# Patient Record
Sex: Female | Born: 1990
Health system: Southern US, Community
[De-identification: ages and names within clinical notes are randomized; demographics above are authoritative.]

## PROBLEM LIST (undated history)

## (undated) DIAGNOSIS — F419 Anxiety disorder, unspecified: Secondary | ICD-10-CM

## (undated) DIAGNOSIS — R251 Tremor, unspecified: Secondary | ICD-10-CM

## (undated) HISTORY — PX: APPENDECTOMY: SHX54

## (undated) HISTORY — PX: WISDOM TOOTH EXTRACTION: SHX21

## (undated) HISTORY — DX: Anxiety disorder, unspecified: F41.9

## (undated) HISTORY — DX: Tremor, unspecified: R25.1

---

## 2014-04-10 DIAGNOSIS — K644 Residual hemorrhoidal skin tags: Secondary | ICD-10-CM | POA: Insufficient documentation

## 2014-04-10 DIAGNOSIS — G250 Essential tremor: Secondary | ICD-10-CM | POA: Insufficient documentation

## 2014-04-10 DIAGNOSIS — H6980 Other specified disorders of Eustachian tube, unspecified ear: Secondary | ICD-10-CM | POA: Insufficient documentation

## 2014-07-03 HISTORY — PX: ARM WOUND REPAIR / CLOSURE: SUR1141

## 2015-06-15 ENCOUNTER — Ambulatory Visit (INDEPENDENT_AMBULATORY_CARE_PROVIDER_SITE_OTHER): Payer: 59 | Admitting: Internal Medicine

## 2015-06-15 ENCOUNTER — Telehealth: Payer: Self-pay | Admitting: Internal Medicine

## 2015-06-15 ENCOUNTER — Encounter: Payer: Self-pay | Admitting: Internal Medicine

## 2015-06-15 VITALS — BP 142/88 | HR 80 | Temp 98.0°F | Resp 16 | Ht 67.0 in | Wt 145.0 lb

## 2015-06-15 DIAGNOSIS — J0100 Acute maxillary sinusitis, unspecified: Secondary | ICD-10-CM | POA: Diagnosis not present

## 2015-06-15 DIAGNOSIS — J0101 Acute recurrent maxillary sinusitis: Secondary | ICD-10-CM

## 2015-06-15 MED ORDER — MOXIFLOXACIN HCL 400 MG PO TABS: 400.0000 mg | ORAL_TABLET | Freq: Every day | ORAL | Status: DC

## 2015-06-15 MED ORDER — LEVOFLOXACIN 500 MG PO TABS: 500.0000 mg | ORAL_TABLET | Freq: Every day | ORAL | Status: AC

## 2015-06-15 NOTE — Progress Notes (Signed)
Subjective:  Patient ID: Erin Byrd, female    DOB: [birth year]  Age: 24 y.o. MRN: [id number]  CC: Sinusitis   HPI Erin Byrd presents for a 5 day history of sinus pain, runny nose, sore throat, chills. She is taking Sudafed for symptom relief.  No outpatient prescriptions prior to visit.   No facility-administered medications prior to visit.    ROS Review of Systems  Constitutional: Positive for chills. Negative for fever, diaphoresis, activity change, appetite change and unexpected weight change.  HENT: Positive for congestion, postnasal drip, rhinorrhea, sinus pressure and sore throat. Negative for facial swelling, nosebleeds, sneezing, tinnitus, trouble swallowing and voice change.   Eyes: Negative.   Respiratory: Negative.  Negative for cough, choking, shortness of breath and stridor.   Cardiovascular: Negative.  Negative for chest pain, palpitations and leg swelling.  Gastrointestinal: Negative.  Negative for abdominal pain.  Endocrine: Negative.   Genitourinary: Negative.   Musculoskeletal: Negative.  Negative for myalgias and back pain.  Skin: Negative.  Negative for color change and rash.  Allergic/Immunologic: Negative.   Neurological: Negative.  Negative for dizziness, weakness and light-headedness.  Hematological: Negative.  Negative for adenopathy. Does not bruise/bleed easily.  Psychiatric/Behavioral: Negative.     Objective:  BP 142/88 mmHg  Pulse 80  Temp(Src) 98 F (36.7 C) (Oral)  Resp 16  Ht 5\' 7"  (1.702 m)  Wt 145 lb (65.772 kg)  BMI 22.71 kg/m2  SpO2 95%  LMP 05/25/2015  BP Readings from Last 3 Encounters:  06/15/15 142/88    Wt Readings from Last 3 Encounters:  06/15/15 145 lb (65.772 kg)    Physical Exam  Constitutional: She is oriented to person, place, and time.  Non-toxic appearance. She does not have a sickly appearance. She does not appear ill. No distress.  HENT:  Right Ear: Hearing, tympanic membrane, external ear and ear  canal normal.  Left Ear: Hearing, tympanic membrane, external ear and ear canal normal.  Nose: Mucosal edema and rhinorrhea present. Right sinus exhibits maxillary sinus tenderness. Right sinus exhibits no frontal sinus tenderness. Left sinus exhibits maxillary sinus tenderness. Left sinus exhibits no frontal sinus tenderness.  Mouth/Throat: Oropharynx is clear and moist and mucous membranes are normal. Mucous membranes are not pale, not dry and not cyanotic. No oral lesions. No trismus in the jaw. No uvula swelling. No oropharyngeal exudate, posterior oropharyngeal edema, posterior oropharyngeal erythema or tonsillar abscesses.  Eyes: Conjunctivae are normal. Right eye exhibits no discharge. Left eye exhibits no discharge. No scleral icterus.  Neck: Normal range of motion. Neck supple. No JVD present. No tracheal deviation present. No thyromegaly present.  Cardiovascular: Normal rate, regular rhythm, normal heart sounds and intact distal pulses.  Exam reveals no gallop and no friction rub.   No murmur heard. Pulmonary/Chest: Effort normal and breath sounds normal. No stridor. No respiratory distress. She has no wheezes. She has no rales. She exhibits no tenderness.  Abdominal: Soft. Bowel sounds are normal. She exhibits no distension and no mass. There is no tenderness. There is no rebound and no guarding.  Musculoskeletal: Normal range of motion. She exhibits no edema or tenderness.  Lymphadenopathy:    She has no cervical adenopathy.  Neurological: She is oriented to person, place, and time.  Skin: Skin is warm and dry. No rash noted. She is not diaphoretic. No erythema. No pallor.    No results found for: WBC, HGB, HCT, PLT, GLUCOSE, CHOL, TRIG, HDL, LDLDIRECT, LDLCALC, ALT, AST, NA, K, CL, CREATININE, BUN, CO2,  TSH, PSA, INR, GLUF, HGBA1C, MICROALBUR  Patient was never admitted.  Assessment & Plan:   Erin Byrd was seen today for sinusitis.  Diagnoses and all orders for this visit:  Acute  maxillary sinusitis, recurrence not specified- she will be out of work today for bedrest, she will continue Sudafed, will treat the infection with a fluoroquinolone since she is allergic to penicillins and macrolides. -     Discontinue: moxifloxacin (AVELOX) 400 MG tablet; Take 1 tablet (400 mg total) by mouth daily at 8 pm.   I am having Erin Byrd maintain her JUNEL FE 1/20 and propranolol.  Meds ordered this encounter  Medications  . JUNEL FE 1/20 1-20 MG-MCG tablet    Sig:     Refill:  1  . propranolol (INDERAL) 20 MG tablet    Sig:     Refill:  1  . DISCONTD: moxifloxacin (AVELOX) 400 MG tablet    Sig: Take 1 tablet (400 mg total) by mouth daily at 8 pm.    Dispense:  7 tablet    Refill:  0     Follow-up: Return in about 3 weeks (around 07/06/2015).  Scarlette Calico, MD

## 2015-06-15 NOTE — Patient Instructions (Signed)

## 2015-06-15 NOTE — Telephone Encounter (Signed)
changed

## 2015-06-15 NOTE — Telephone Encounter (Signed)
[facility] on [address] [place] called stating prescription for moxifloxacin (AVELOX) 400 MG tablet [id number]. They say ciprofloxacin or the levofloxacin will be cheaper and wants to know if you want change it to one of them.

## 2015-07-09 ENCOUNTER — Encounter: Payer: Self-pay | Admitting: Family

## 2015-07-09 ENCOUNTER — Ambulatory Visit (INDEPENDENT_AMBULATORY_CARE_PROVIDER_SITE_OTHER): Payer: 59 | Admitting: Family

## 2015-07-09 ENCOUNTER — Ambulatory Visit: Payer: 59 | Admitting: Family

## 2015-07-09 VITALS — BP 138/92 | HR 83 | Temp 98.1°F | Resp 18 | Ht 67.0 in | Wt 156.0 lb

## 2015-07-09 DIAGNOSIS — Z0000 Encounter for general adult medical examination without abnormal findings: Secondary | ICD-10-CM | POA: Diagnosis not present

## 2015-07-09 NOTE — Progress Notes (Signed)
Subjective:    Patient ID: Erin Byrd, female    DOB: October 11, 1990, 25 y.o.   MRN: [id number]  Chief Complaint  Patient presents with  . CPE    not fasting    HPI:  Erin Byrd is a 25 y.o. female who presents today for an annual wellness visit.   1) Health Maintenance -   Diet - Averages 3 meals per day consisting of cereal, dairy, starches, fruits, vegetables, chicken, beef, pork and fish  Exercise - No current structured exercise   2) Preventative Exams / Immunizations:  Dental -- Up to date  Vision -- Up to date   Health Maintenance  Topic Date Due  . INFLUENZA VACCINE  02/01/2016  . PAP SMEAR  01/31/2018  . TETANUS/TDAP  04/02/2023  . HIV Screening  Addressed    Immunization History  Administered Date(s) Administered  . Influenza-Unspecified 02/22/2015  . Tdap 04/01/2013    Allergies  Allergen Reactions  . Erythromycin Nausea And Vomiting  . Penicillins Rash     Outpatient Prescriptions Prior to Visit  Medication Sig Dispense Refill  . JUNEL FE 1/20 1-20 MG-MCG tablet   1  . propranolol (INDERAL) 20 MG tablet   1   No facility-administered medications prior to visit.     Past Medical History  Diagnosis Date  . Chicken pox      Past Surgical History  Procedure Laterality Date  . Wisdom tooth extraction    . Arm surgery       Family History  Problem Relation Age of Onset  . Hypertension Mother   . Hypertension Father   . Hypertension Paternal Grandmother   . Hypertension Paternal Grandfather   . Diabetes Paternal Grandfather      Social History   Social History  . Marital Status: Single    Spouse Name: N/A  . Number of Children: 0  . Years of Education: 16   Occupational History  . Clinic Registrar    Social History Main Topics  . Smoking status: Never Smoker   . Smokeless tobacco: Never Used  . Alcohol Use: 1.2 - 1.8 oz/week    0 Standard drinks or equivalent, 2-3 Cans of beer per week  . Drug Use: No  .  Sexual Activity: Yes    Birth Control/ Protection: Pill   Other Topics Concern  . Not on file   Social History Narrative   Fun: Relax at home, go out with friends, shop    Review of Systems  Constitutional: Denies fever, chills, fatigue, or significant weight gain/loss. HENT: Head: Denies headache or neck pain Ears: Denies changes in hearing, ringing in ears, earache, drainage Nose: Denies discharge, stuffiness, itching, nosebleed, sinus pain Throat: Denies sore throat, hoarseness, dry mouth, sores, thrush Eyes: Denies loss/changes in vision, pain, redness, blurry/double vision, flashing lights Cardiovascular: Denies chest pain/discomfort, tightness, palpitations, shortness of breath with activity, difficulty lying down, swelling, sudden awakening with shortness of breath Respiratory: Denies shortness of breath, cough, sputum production, wheezing Gastrointestinal: Denies dysphasia, heartburn, change in appetite, nausea, change in bowel habits, rectal bleeding, constipation, diarrhea, yellow skin or eyes Genitourinary: Denies frequency, urgency, burning/pain, blood in urine, incontinence, change in urinary strength. Musculoskeletal: Denies muscle/joint pain, stiffness, back pain, redness or swelling of joints, trauma Skin: Denies rashes, lumps, itching, dryness, color changes, or hair/nail changes Neurological: Denies dizziness, fainting, seizures, weakness, numbness, tingling, tremor Psychiatric - Denies nervousness, stress, depression or memory loss Endocrine: Denies heat or cold intolerance, sweating, frequent urination, excessive thirst,  changes in appetite Hematologic: Denies ease of bruising or bleeding     Objective:     BP 138/92 mmHg  Pulse 83  Temp(Src) 98.1 F (36.7 C) (Oral)  Resp 18  Ht 5\' 7"  (1.702 m)  Wt 156 lb (70.761 kg)  BMI 24.43 kg/m2  SpO2 98%  LMP 05/25/2015 Nursing note and vital signs reviewed.  Physical Exam  Constitutional: She is oriented to  person, place, and time. She appears well-developed and well-nourished.  HENT:  Head: Normocephalic.  Right Ear: Hearing, tympanic membrane, external ear and ear canal normal.  Left Ear: Hearing, tympanic membrane, external ear and ear canal normal.  Nose: Nose normal.  Mouth/Throat: Uvula is midline, oropharynx is clear and moist and mucous membranes are normal.  Eyes: Conjunctivae and EOM are normal. Pupils are equal, round, and reactive to light.  Neck: Neck supple. No JVD present. No tracheal deviation present. No thyromegaly present.  Cardiovascular: Normal rate, regular rhythm, normal heart sounds and intact distal pulses.   Pulmonary/Chest: Effort normal and breath sounds normal.  Abdominal: Soft. Bowel sounds are normal. She exhibits no distension and no mass. There is no tenderness. There is no rebound and no guarding.  Musculoskeletal: Normal range of motion. She exhibits no edema or tenderness.  Lymphadenopathy:    She has no cervical adenopathy.  Neurological: She is alert and oriented to person, place, and time. She has normal reflexes. No cranial nerve deficit. She exhibits normal muscle tone. Coordination normal.  Skin: Skin is warm and dry.  Psychiatric: She has a normal mood and affect. Her behavior is normal. Judgment and thought content normal.       Assessment & Plan:   Problem List Items Addressed This Visit      Other   Routine general medical examination at a health care facility - Primary    1) Anticipatory Guidance: Discussed importance of wearing a seatbelt while driving and not texting while driving; changing batteries in smoke detector at least once annually; wearing suntan lotion when outside; eating a balanced and moderate diet; getting physical activity at least 30 minutes per day.  2) Immunizations / Screenings / Labs:  All immunizations are up-to-date per recommendations. All screenings are up-to-date per recommendations. Obtain CBC, CMET, Lipid profile  and TSH.   Overall well exam with minimal risk factors for cardiovascular disease. Encouraged increased physical activity with goal of 30 minutes most days a week with moderate level intensity. She is of good weight. Encourage continue nutritional intake that is moderate and varied and emphasizes nutrient dense foods that are low in saturated/transfer fats. Continue healthy lifestyle behaviors and choices. Follow-up office visit pending blood work. Follow-up prevention exam in 1 year.       Relevant Orders   Lipid panel   Comprehensive metabolic panel   CBC   TSH

## 2015-07-09 NOTE — Patient Instructions (Signed)
Thank you for choosing [facility].  Summary/Instructions:  Health Maintenance, Female Adopting a healthy lifestyle and getting preventive care can go a long way to promote health and wellness. Talk with your health care provider about what schedule of regular examinations is right for you. This is a good chance for you to check in with your provider about disease prevention and staying healthy. In between checkups, there are plenty of things you can do on your own. Experts have done a lot of research about which lifestyle changes and preventive measures are most likely to keep you healthy. Ask your health care provider for more information. WEIGHT AND DIET  Eat a healthy diet  Be sure to include plenty of vegetables, fruits, low-fat dairy products, and lean protein.  Do not eat a lot of foods high in solid fats, added sugars, or salt.  Get regular exercise. This is one of the most important things you can do for your health.  Most adults should exercise for at least 150 minutes each week. The exercise should increase your heart rate and make you sweat (moderate-intensity exercise).  Most adults should also do strengthening exercises at least twice a week. This is in addition to the moderate-intensity exercise.  Maintain a healthy weight  Body mass index (BMI) is a measurement that can be used to identify possible weight problems. It estimates body fat based on height and weight. Your health care provider can help determine your BMI and help you achieve or maintain a healthy weight.  For females 28 years of age and older:   A BMI below 18.5 is considered underweight.  A BMI of 18.5 to 24.9 is normal.  A BMI of 25 to 29.9 is considered overweight.  A BMI of 30 and above is considered obese.  Watch levels of cholesterol and blood lipids  You should start having your blood tested for lipids and cholesterol at 25 years of age, then have this test every 5 years.  You may need  to have your cholesterol levels checked more often if:  Your lipid or cholesterol levels are high.  You are older than 25 years of age.  You are at high risk for heart disease.  CANCER SCREENING   Lung Cancer  Lung cancer screening is recommended for adults 76-55 years old who are at high risk for lung cancer because of a history of smoking.  A yearly low-dose CT scan of the lungs is recommended for people who:  Currently smoke.  Have quit within the past 15 years.  Have at least a 30-pack-year history of smoking. A pack year is smoking an average of one pack of cigarettes a day for 1 year.  Yearly screening should continue until it has been 15 years since you quit.  Yearly screening should stop if you develop a health problem that would prevent you from having lung cancer treatment.  Breast Cancer  Practice breast self-awareness. This means understanding how your breasts normally appear and feel.  It also means doing regular breast self-exams. Let your health care provider know about any changes, no matter how small.  If you are in your 20s or 30s, you should have a clinical breast exam (CBE) by a health care provider every 1-3 years as part of a regular health exam.  If you are 28 or older, have a CBE every year. Also consider having a breast X-ray (mammogram) every year.  If you have a family history of breast cancer, talk to your  health care provider about genetic screening.  If you are at high risk for breast cancer, talk to your health care provider about having an MRI and a mammogram every year.  Breast cancer gene (BRCA) assessment is recommended for women who have family members with BRCA-related cancers. BRCA-related cancers include:  Breast.  Ovarian.  Tubal.  Peritoneal cancers.  Results of the assessment will determine the need for genetic counseling and BRCA1 and BRCA2 testing. Cervical Cancer Your health care provider may recommend that you be  screened regularly for cancer of the pelvic organs (ovaries, uterus, and vagina). This screening involves a pelvic examination, including checking for microscopic changes to the surface of your cervix (Pap test). You may be encouraged to have this screening done every 3 years, beginning at age 78.  For women ages 13-65, health care providers may recommend pelvic exams and Pap testing every 3 years, or they may recommend the Pap and pelvic exam, combined with testing for human papilloma virus (HPV), every 5 years. Some types of HPV increase your risk of cervical cancer. Testing for HPV may also be done on women of any age with unclear Pap test results.  Other health care providers may not recommend any screening for nonpregnant women who are considered low risk for pelvic cancer and who do not have symptoms. Ask your health care provider if a screening pelvic exam is right for you.  If you have had past treatment for cervical cancer or a condition that could lead to cancer, you need Pap tests and screening for cancer for at least 20 years after your treatment. If Pap tests have been discontinued, your risk factors (such as having a new sexual partner) need to be reassessed to determine if screening should resume. Some women have medical problems that increase the chance of getting cervical cancer. In these cases, your health care provider may recommend more frequent screening and Pap tests. Colorectal Cancer  This type of cancer can be detected and often prevented.  Routine colorectal cancer screening usually begins at 25 years of age and continues through 25 years of age.  Your health care provider may recommend screening at an earlier age if you have risk factors for colon cancer.  Your health care provider may also recommend using home test kits to check for hidden blood in the stool.  A small camera at the end of a tube can be used to examine your colon directly (sigmoidoscopy or colonoscopy).  This is done to check for the earliest forms of colorectal cancer.  Routine screening usually begins at age 9.  Direct examination of the colon should be repeated every 5-10 years through 25 years of age. However, you may need to be screened more often if early forms of precancerous polyps or small growths are found. Skin Cancer  Check your skin from head to toe regularly.  Tell your health care provider about any new moles or changes in moles, especially if there is a change in a mole's shape or color.  Also tell your health care provider if you have a mole that is larger than the size of a pencil eraser.  Always use sunscreen. Apply sunscreen liberally and repeatedly throughout the day.  Protect yourself by wearing long sleeves, pants, a wide-brimmed hat, and sunglasses whenever you are outside. HEART DISEASE, DIABETES, AND HIGH BLOOD PRESSURE   High blood pressure causes heart disease and increases the risk of stroke. High blood pressure is more likely to develop in:  People who have blood pressure in the high end of the normal range (130-139/85-89 mm Hg).  People who are overweight or obese.  People who are African American.  If you are 66-57 years of age, have your blood pressure checked every 3-5 years. If you are 80 years of age or older, have your blood pressure checked every year. You should have your blood pressure measured twice--once when you are at a hospital or clinic, and once when you are not at a hospital or clinic. Record the average of the two measurements. To check your blood pressure when you are not at a hospital or clinic, you can use:  An automated blood pressure machine at a pharmacy.  A home blood pressure monitor.  If you are between 70 years and 51 years old, ask your health care provider if you should take aspirin to prevent strokes.  Have regular diabetes screenings. This involves taking a blood sample to check your fasting blood sugar level.  If you  are at a normal weight and have a low risk for diabetes, have this test once every three years after 25 years of age.  If you are overweight and have a high risk for diabetes, consider being tested at a younger age or more often. PREVENTING INFECTION  Hepatitis B  If you have a higher risk for hepatitis B, you should be screened for this virus. You are considered at high risk for hepatitis B if:  You were born in a country where hepatitis B is common. Ask your health care provider which countries are considered high risk.  Your parents were born in a high-risk country, and you have not been immunized against hepatitis B (hepatitis B vaccine).  You have HIV or AIDS.  You use needles to inject street drugs.  You live with someone who has hepatitis B.  You have had sex with someone who has hepatitis B.  You get hemodialysis treatment.  You take certain medicines for conditions, including cancer, organ transplantation, and autoimmune conditions. Hepatitis C  Blood testing is recommended for:  Everyone born from [birth year] through 1965.  Anyone with known risk factors for hepatitis C. Sexually transmitted infections (STIs)  You should be screened for sexually transmitted infections (STIs) including gonorrhea and chlamydia if:  You are sexually active and are younger than 25 years of age.  You are older than 25 years of age and your health care provider tells you that you are at risk for this type of infection.  Your sexual activity has changed since you were last screened and you are at an increased risk for chlamydia or gonorrhea. Ask your health care provider if you are at risk.  If you do not have HIV, but are at risk, it may be recommended that you take a prescription medicine daily to prevent HIV infection. This is called pre-exposure prophylaxis (PrEP). You are considered at risk if:  You are sexually active and do not regularly use condoms or know the HIV status of your  partner(s).  You take drugs by injection.  You are sexually active with a partner who has HIV. Talk with your health care provider about whether you are at high risk of being infected with HIV. If you choose to begin PrEP, you should first be tested for HIV. You should then be tested every 3 months for as long as you are taking PrEP.  PREGNANCY   If you are premenopausal and you may become pregnant, ask your health  care provider about preconception counseling.  If you may become pregnant, take 400 to 800 micrograms (mcg) of folic acid every day.  If you want to prevent pregnancy, talk to your health care provider about birth control (contraception). OSTEOPOROSIS AND MENOPAUSE   Osteoporosis is a disease in which the bones lose minerals and strength with aging. This can result in serious bone fractures. Your risk for osteoporosis can be identified using a bone density scan.  If you are 42 years of age or older, or if you are at risk for osteoporosis and fractures, ask your health care provider if you should be screened.  Ask your health care provider whether you should take a calcium or vitamin D supplement to lower your risk for osteoporosis.  Menopause may have certain physical symptoms and risks.  Hormone replacement therapy may reduce some of these symptoms and risks. Talk to your health care provider about whether hormone replacement therapy is right for you.  HOME CARE INSTRUCTIONS   Schedule regular health, dental, and eye exams.  Stay current with your immunizations.   Do not use any tobacco products including cigarettes, chewing tobacco, or electronic cigarettes.  If you are pregnant, do not drink alcohol.  If you are breastfeeding, limit how much and how often you drink alcohol.  Limit alcohol intake to no more than 1 drink per day for nonpregnant women. One drink equals 12 ounces of beer, 5 ounces of wine, or 1 ounces of hard liquor.  Do not use street drugs.  Do  not share needles.  Ask your health care provider for help if you need support or information about quitting drugs.  Tell your health care provider if you often feel depressed.  Tell your health care provider if you have ever been abused or do not feel safe at home.   This information is not intended to replace advice given to you by your health care provider. Make sure you discuss any questions you have with your health care provider.   Document Released: 01/02/2011 Document Revised: 07/10/2014 Document Reviewed: 05/21/2013 Elsevier Interactive Patient Education [employer].

## 2015-07-09 NOTE — Assessment & Plan Note (Addendum)
1) Anticipatory Guidance: Discussed importance of wearing a seatbelt while driving and not texting while driving; changing batteries in smoke detector at least once annually; wearing suntan lotion when outside; eating a balanced and moderate diet; getting physical activity at least 30 minutes per day.  2) Immunizations / Screenings / Labs:  All immunizations are up-to-date per recommendations. All screenings are up-to-date per recommendations. Obtain CBC, CMET, Lipid profile and TSH.   Overall well exam with minimal risk factors for cardiovascular disease. Encouraged increased physical activity with goal of 30 minutes most days a week with moderate level intensity. She is of good weight. Encourage continue nutritional intake that is moderate and varied and emphasizes nutrient dense foods that are low in saturated/transfer fats. Continue healthy lifestyle behaviors and choices. Follow-up office visit pending blood work. Follow-up prevention exam in 1 year.

## 2015-07-29 ENCOUNTER — Ambulatory Visit (INDEPENDENT_AMBULATORY_CARE_PROVIDER_SITE_OTHER): Payer: 59 | Admitting: Family

## 2015-07-29 VITALS — BP 128/82 | HR 76 | Temp 98.4°F | Resp 18

## 2015-07-29 DIAGNOSIS — J069 Acute upper respiratory infection, unspecified: Secondary | ICD-10-CM | POA: Diagnosis not present

## 2015-07-29 MED ORDER — DOXYCYCLINE HYCLATE 100 MG PO TABS: 100.0000 mg | ORAL_TABLET | Freq: Two times a day (BID) | ORAL | Status: DC

## 2015-07-29 NOTE — Progress Notes (Signed)
   Subjective:    Patient ID: Erin Byrd, female    DOB: 06-20-1991, 25 y.o.   MRN: [id number]  Chief Complaint  Patient presents with  . Sore Throat    Congestion, sore throat    HPI:  Erin Byrd is a 25 y.o. female who  has a past medical history of Chicken pox. and presents today for an acute office visit.  This is a new problem. Associated symptoms of congestion, sore throat, and productive cough that has been progressively worsening and has been going for about 3 days. Denies fevers. Modifying factors include cold/flu medications which have helped minimally with her symptoms. Severity of the cough is enough to disturb her sleep pattern. Denies any recent antibiotic use.   Allergies  Allergen Reactions  . Erythromycin Nausea And Vomiting  . Penicillins Rash     Current Outpatient Prescriptions on File Prior to Visit  Medication Sig Dispense Refill  . JUNEL FE 1/20 1-20 MG-MCG tablet   1  . propranolol (INDERAL) 20 MG tablet   1   No current facility-administered medications on file prior to visit.    Review of Systems  Constitutional: Negative for fever and chills.  HENT: Positive for congestion, sinus pressure and sore throat. Negative for sneezing.   Respiratory: Positive for cough. Negative for chest tightness and shortness of breath.   Neurological: Positive for headaches.      Objective:    BP 128/82 mmHg  Pulse 76  Temp(Src) 98.4 F (36.9 C)  Resp 18  SpO2 97% Nursing note and vital signs reviewed.  Physical Exam  Constitutional: She is oriented to person, place, and time. She appears well-developed and well-nourished. No distress.  HENT:  Right Ear: Hearing, tympanic membrane, external ear and ear canal normal.  Left Ear: Hearing, tympanic membrane, external ear and ear canal normal.  Nose: Right sinus exhibits frontal sinus tenderness. Right sinus exhibits no maxillary sinus tenderness. Left sinus exhibits frontal sinus tenderness. Left sinus  exhibits no maxillary sinus tenderness.  Mouth/Throat: Uvula is midline, oropharynx is clear and moist and mucous membranes are normal.  Cardiovascular: Normal rate, regular rhythm, normal heart sounds and intact distal pulses.   Pulmonary/Chest: Effort normal and breath sounds normal.  Neurological: She is alert and oriented to person, place, and time.  Skin: Skin is warm and dry.  Psychiatric: She has a normal mood and affect. Her behavior is normal. Judgment and thought content normal.       Assessment & Plan:   Problem List Items Addressed This Visit      Respiratory   Acute upper respiratory infection - Primary    Symptoms and exam consistent with upper respiratory infection however cannot rule out developing sinusitis. Recommends treatment with OTC medications for symptom relief and supportive care. Written prescription for doxycycline provided with instructions for watchful waiting for the next 2-3 days. If symptoms worsen start antibiotics. Follow up as needed.       Relevant Medications   doxycycline (VIBRA-TABS) 100 MG tablet

## 2015-07-29 NOTE — Assessment & Plan Note (Signed)
Symptoms and exam consistent with upper respiratory infection however cannot rule out developing sinusitis. Recommends treatment with OTC medications for symptom relief and supportive care. Written prescription for doxycycline provided with instructions for watchful waiting for the next 2-3 days. If symptoms worsen start antibiotics. Follow up as needed.

## 2015-07-29 NOTE — Patient Instructions (Addendum)
Thank you for choosing [facility].  Summary/Instructions:  Your prescription(s) have been submitted to your pharmacy or been printed and provided for you. Please take as directed and contact our office if you believe you are having problem(s) with the medication(s) or have any questions.  If your symptoms worsen or fail to improve, please contact our office for further instruction, or in case of emergency go directly to the emergency room at the closest medical facility.   General Recommendations:    Please drink plenty of fluids.  Get plenty of rest   Sleep in humidified air  Use saline nasal sprays  Netti pot   OTC Medications:  Decongestants - helps relieve congestion   Flonase (generic fluticasone) or Nasacort (generic triamcinolone) - please make sure to use the "cross-over" technique at a 45 degree angle towards the opposite eye as opposed to straight up the nasal passageway.   Sudafed (generic pseudoephedrine - Note this is the one that is available behind the pharmacy counter); Products with phenylephrine (-PE) may also be used but is often not as effective as pseudoephedrine.   If you have HIGH BLOOD PRESSURE - Coricidin HBP; AVOID any product that is -D as this contains pseudoephedrine which may increase your blood pressure.  Afrin (oxymetazoline) every 6-8 hours for up to 3 days.   Allergies - helps relieve runny nose, itchy eyes and sneezing   Claritin (generic loratidine), Allegra (fexofenidine), or Zyrtec (generic cyrterizine) for runny nose. These medications should not cause drowsiness.  Note - Benadryl (generic diphenhydramine) may be used however may cause drowsiness  Cough -   Delsym or Robitussin (generic dextromethorphan)  Expectorants - helps loosen mucus to ease removal   Mucinex (generic guaifenesin) as directed on the package.  Headaches / General Aches   Tylenol (generic acetaminophen) - DO NOT EXCEED 3 grams (3,000 mg) in a 24  hour time period  Advil/Motrin (generic ibuprofen)   Sore Throat -   Salt water gargle   Chloraseptic (generic benzocaine) spray or lozenges / Sucrets (generic dyclonine)      Upper Respiratory Infection, Adult Most upper respiratory infections (URIs) are a viral infection of the air passages leading to the lungs. A URI affects the nose, throat, and upper air passages. The most common type of URI is nasopharyngitis and is typically referred to as "the common cold." URIs run their course and usually go away on their own. Most of the time, a URI does not require medical attention, but sometimes a bacterial infection in the upper airways can follow a viral infection. This is called a secondary infection. Sinus and middle ear infections are common types of secondary upper respiratory infections. Bacterial pneumonia can also complicate a URI. A URI can worsen asthma and chronic obstructive pulmonary disease (COPD). Sometimes, these complications can require emergency medical care and may be life threatening.  CAUSES Almost all URIs are caused by viruses. A virus is a type of germ and can spread from one person to another.  RISKS FACTORS You may be at risk for a URI if:  4. You smoke.  5. You have chronic heart or lung disease. 6. You have a weakened defense (immune) system.  7. You are very young or very old.  8. You have nasal allergies or asthma. 9. You work in crowded or poorly ventilated areas. 10. You work in health care facilities or schools. SIGNS AND SYMPTOMS  Symptoms typically develop 2-3 days after you come in contact with a cold virus.   Most viral URIs last 7-10 days. However, viral URIs from the influenza virus (flu virus) can last 14-18 days and are typically more severe. Symptoms may include:  2. Runny or stuffy (congested) nose.  3. Sneezing.  4. Cough.  5. Sore throat.  6. Headache.  7. Fatigue.  8. Fever.  9. Loss of appetite.  10. Pain in your forehead,  behind your eyes, and over your cheekbones (sinus pain). 11. Muscle aches.  DIAGNOSIS  Your health care provider may diagnose a URI by: 2. Physical exam. 3. Tests to check that your symptoms are not due to another condition such as: 1. Strep throat. 2. Sinusitis. 3. Pneumonia. 4. Asthma. TREATMENT  A URI goes away on its own with time. It cannot be cured with medicines, but medicines may be prescribed or recommended to relieve symptoms. Medicines may help: 3. Reduce your fever. 4. Reduce your cough. 5. Relieve nasal congestion. HOME CARE INSTRUCTIONS  3. Take medicines only as directed by your health care provider.  4. Gargle warm saltwater or take cough drops to comfort your throat as directed by your health care provider. 5. Use a warm mist humidifier or inhale steam from a shower to increase air moisture. This may make it easier to breathe. 6. Drink enough fluid to keep your urine clear or pale yellow.  7. Eat soups and other clear broths and maintain good nutrition.  8. Rest as needed.  9. Return to work when your temperature has returned to normal or as your health care provider advises. You may need to stay home longer to avoid infecting others. You can also use a face mask and careful hand washing to prevent spread of the virus. 10. Increase the usage of your inhaler if you have asthma.  11. Do not use any tobacco products, including cigarettes, chewing tobacco, or electronic cigarettes. If you need help quitting, ask your health care provider. PREVENTION  The best way to protect yourself from getting a cold is to practice good hygiene.  6. Avoid oral or hand contact with people with cold symptoms.  7. Wash your hands often if contact occurs.  There is no clear evidence that vitamin C, vitamin E, echinacea, or exercise reduces the chance of developing a cold. However, it is always recommended to get plenty of rest, exercise, and practice good nutrition.  SEEK MEDICAL CARE  IF:   You are getting worse rather than better.   Your symptoms are not controlled by medicine.   You have chills.  You have worsening shortness of breath.  You have brown or red mucus.  You have yellow or brown nasal discharge.  You have pain in your face, especially when you bend forward.  You have a fever.  You have swollen neck glands.  You have pain while swallowing.  You have white areas in the back of your throat. SEEK IMMEDIATE MEDICAL CARE IF:  2. You have severe or persistent: 1. Headache. 2. Ear pain. 3. Sinus pain. 4. Chest pain. 3. You have chronic lung disease and any of the following: 1. Wheezing. 2. Prolonged cough. 3. Coughing up blood. 4. A change in your usual mucus. 4. You have a stiff neck. 5. You have changes in your: 1. Vision. 2. Hearing. 3. Thinking. 4. Mood. MAKE SURE YOU:  3. Understand these instructions. 4. Will watch your condition. 5. Will get help right away if you are not doing well or get worse.   This information is not intended to replace   advice given to you by your health care provider. Make sure you discuss any questions you have with your health care provider.   Document Released: 12/13/2000 Document Revised: 11/03/2014 Document Reviewed: 09/24/2013 Elsevier Interactive Patient Education 2016 Elsevier Inc.   

## 2015-07-30 ENCOUNTER — Other Ambulatory Visit: Payer: Self-pay | Admitting: Family

## 2015-07-30 MED ORDER — HYDROCOD POLST-CPM POLST ER 10-8 MG/5ML PO SUER: 5.0000 mL | Freq: Every evening | ORAL | Status: DC | PRN

## 2015-11-15 ENCOUNTER — Telehealth: Payer: Self-pay

## 2015-11-15 MED ORDER — HYDROCORTISONE 2.5 % RE CREA: 1.0000 "application " | TOPICAL_CREAM | Freq: Two times a day (BID) | RECTAL | Status: DC

## 2015-11-15 NOTE — Telephone Encounter (Signed)
Sent to pharmacy 

## 2015-11-15 NOTE — Telephone Encounter (Signed)
Has had vomiting with several bouts of diarhhea last week---now continues with burning, itching and bright red blood when she goes to bathroom---possible hemorrhoids---can you please send something in to [facility] for her, thanks

## 2015-12-31 ENCOUNTER — Encounter: Payer: Self-pay | Admitting: Family

## 2015-12-31 ENCOUNTER — Ambulatory Visit (INDEPENDENT_AMBULATORY_CARE_PROVIDER_SITE_OTHER): Payer: 59 | Admitting: Family

## 2015-12-31 VITALS — BP 128/82 | HR 75 | Temp 98.2°F | Resp 14 | Ht 67.0 in | Wt 155.0 lb

## 2015-12-31 DIAGNOSIS — N907 Vulvar cyst: Secondary | ICD-10-CM | POA: Diagnosis not present

## 2015-12-31 DIAGNOSIS — N9089 Other specified noninflammatory disorders of vulva and perineum: Secondary | ICD-10-CM

## 2015-12-31 NOTE — Progress Notes (Signed)
Subjective:    Patient ID: Erin Byrd, female    DOB: 1991-06-16, 25 y.o.   MRN: [id number]  Chief Complaint  Patient presents with  . Skin Problem    HPI:  Erin Byrd is a 25 y.o. female who  has a past medical history of Chicken pox. and presents today for an acute office visit.  This is a new problem. Associated symptom of a skin tag located located lateral to the right labia majora has been going on for several weeks however has become irritated and inflammed over the past several days. Denies any fevers. Modifying factors include covering it which did not help very much.    Allergies  Allergen Reactions  . Erythromycin Nausea And Vomiting  . Penicillins Rash     Current Outpatient Prescriptions on File Prior to Visit  Medication Sig Dispense Refill  . chlorpheniramine-HYDROcodone (TUSSIONEX PENNKINETIC ER) 10-8 MG/5ML SUER Take 5 mLs by mouth at bedtime as needed. 115 mL 0  . doxycycline (VIBRA-TABS) 100 MG tablet Take 1 tablet (100 mg total) by mouth 2 (two) times daily. 20 tablet 0  . hydrocortisone (ANUSOL-HC) 2.5 % rectal cream Place 1 application rectally 2 (two) times daily. 30 g 0  . JUNEL FE 1/20 1-20 MG-MCG tablet   1  . propranolol (INDERAL) 20 MG tablet   1   No current facility-administered medications on file prior to visit.     Review of Systems  Constitutional: Negative for fever and chills.  Skin:       Positive for skin tag      Objective:    BP 128/82 mmHg  Pulse 75  Temp(Src) 98.2 F (36.8 C) (Oral)  Resp 14  Ht 5\' 7"  (1.702 m)  Wt 155 lb (70.308 kg)  BMI 24.27 kg/m2  SpO2 99% Nursing note and vital signs reviewed.  Physical Exam  Constitutional: She is oriented to person, place, and time. She appears well-developed and well-nourished. No distress.  Cardiovascular: Normal rate, regular rhythm, normal heart sounds and intact distal pulses.   Pulmonary/Chest: Effort normal and breath sounds normal.  Genitourinary:      Neurological: She is alert and oriented to person, place, and time.  Skin: Skin is warm and dry.  Psychiatric: She has a normal mood and affect. Her behavior is normal. Judgment and thought content normal.       Procedure : Cryosurgery for Skin Tag Removal.  Risks including unsuccessful procedure , bleeding, infection, bruising, scar, and skin discoloration or a potential need for a repeat  procedure were explained to the patient in detail as well as the benefits. Informed consent was obtained verbally and witnessed. It was determined the best approach at this time would be cryosurgical removal of the lesion. The site was identified and confirmed. A time out was performed. The site was prepped and draped. Cleansed with alcohol swab. The Histofreezer Cryosurgical system was prepared according to manufacturers instructions and applied to the site for 2 rounds of 40 seconds using the 2 mm probe. There was minimal discomfort and the procedure was tolerated without complication. Antibiotic ointment was placed on the site and dressed with a bandage. Post-procedure instructions were reviewed including side care and signs of infection.       Assessment & Plan:   Problem List Items Addressed This Visit      Genitourinary   Skin tag of labia - Primary    Skin tag just lateral to the right labia majora with cryosurgical application  for removal tolerated without complication. Post care instructions provided with follow up precautions given.           I am having Ms. Market maintain her JUNEL FE 1/20, propranolol, doxycycline, chlorpheniramine-HYDROcodone, and hydrocortisone.   Follow-up: No Follow-up on file.  Mauricio Po, FNP

## 2015-12-31 NOTE — Patient Instructions (Addendum)
Thank you for choosing [facility].  Summary/Instructions:  Monitor for signs of infection.  Keep clean with soap and water.   Cryosurgery for Skin Conditions Cryosurgery, also called cryotherapy, is the use of extreme cold to freeze and remove abnormal or diseased tissue. Growths on the skin such as warts, precancerous skin lesions (actinic keratoses), and some kinds of skin cancer may be removed with cryosurgery. LET [facility] CARE PROVIDER KNOW ABOUT:  Any allergies you have.  All medicines you are taking, including vitamins, herbs, eye drops, creams, and over-the-counter medicines.  Previous problems you or members of your family have had with the use of anesthetics.  Any blood disorders you have.  Previous surgeries you have had.  Medical conditions you have. RISKS AND COMPLICATIONS Generally, this is a safe procedure. However, as with any procedure, complications can occur. Possible complications include:  Scars.  Changes in skin color (lighter or darker than normal skin tone).  Swelling.  Nerve damage and loss of feeling (rare). BEFORE THE PROCEDURE No preparation is necessary. PROCEDURE  Cryosurgery usually takes a few minutes and can be done in your health care provider's office. There are different methods for performing cryosurgery.   Your health care provider may use a device (probe) that has liquid nitrogen flowing through it. The liquid nitrogen cools the probe. The probe is then applied to the growth until it is frozen and destroyed.  Your health care provider may spray liquid nitrogen directly on the growth. AFTER THE PROCEDURE Shortly after the procedure, the treated area will become red and swollen. This is normal. You will be advised to keep the treated area clean and covered with a bandage until healed. You will be able to go home shortly after the procedure. You may need the treatment again if the growth comes back.   This information is not  intended to replace advice given to you by your health care provider. Make sure you discuss any questions you have with your health care provider.   Document Released: 06/16/2000 Document Revised: 02/19/2013 Document Reviewed: 01/17/2013 Elsevier Interactive Patient Education [employer].

## 2015-12-31 NOTE — Assessment & Plan Note (Signed)
Skin tag just lateral to the right labia majora with cryosurgical application for removal tolerated without complication. Post care instructions provided with follow up precautions given.

## 2016-01-03 ENCOUNTER — Ambulatory Visit: Payer: 59 | Admitting: Family

## 2016-02-21 ENCOUNTER — Ambulatory Visit: Payer: 59 | Admitting: Family Medicine

## 2016-07-01 ENCOUNTER — Ambulatory Visit (INDEPENDENT_AMBULATORY_CARE_PROVIDER_SITE_OTHER): Payer: 59 | Admitting: Physician Assistant

## 2016-07-01 VITALS — BP 114/72 | HR 80 | Temp 98.5°F | Resp 16 | Ht 67.0 in | Wt 159.2 lb

## 2016-07-01 DIAGNOSIS — R05 Cough: Secondary | ICD-10-CM | POA: Diagnosis not present

## 2016-07-01 DIAGNOSIS — B349 Viral infection, unspecified: Secondary | ICD-10-CM

## 2016-07-01 DIAGNOSIS — R0981 Nasal congestion: Secondary | ICD-10-CM | POA: Diagnosis not present

## 2016-07-01 DIAGNOSIS — R059 Cough, unspecified: Secondary | ICD-10-CM

## 2016-07-01 MED ORDER — MUCINEX DM MAXIMUM STRENGTH 60-1200 MG PO TB12: 1.0000 | ORAL_TABLET | Freq: Two times a day (BID) | ORAL | 1 refills | Status: DC

## 2016-07-01 MED ORDER — HYDROCODONE-HOMATROPINE 5-1.5 MG/5ML PO SYRP: 5.0000 mL | ORAL_SOLUTION | Freq: Three times a day (TID) | ORAL | 0 refills | Status: DC | PRN

## 2016-07-01 MED ORDER — FLUTICASONE PROPIONATE 50 MCG/ACT NA SUSP: 2.0000 | Freq: Every day | NASAL | 6 refills | Status: DC

## 2016-07-01 NOTE — Patient Instructions (Addendum)
Continue to push fluids. Warm salt water gargles is helpful for sore throat. Give a neti-pot a try.  Get plenty of rest.  Warm tea with honey, lemon, ginger slices and cloves.   Thank you for coming in today. I hope you feel we met your needs.  Feel free to call [facility] if you have any questions or further requests.  Please consider signing up for MyChart if you do not already have it, as this is a great way to communicate with me.  Best,  Whitney McVey, PA-C   IF you received an x-ray today, you will receive an invoice from [facility]. Please contact [facility] at [phone] with questions or concerns regarding your invoice.   IF you received labwork today, you will receive an invoice from [facility]. Please contact [facility] at [phone] with questions or concerns regarding your invoice.   Our billing staff will not be able to assist you with questions regarding bills from these companies.  You will be contacted with the lab results as soon as they are available. The fastest way to get your results is to activate your My Chart account. Instructions are located on the last page of this paperwork. If you have not heard from [place] regarding the results in 2 weeks, please contact this office.

## 2016-07-01 NOTE — Progress Notes (Signed)
Erin Byrd  MRN: [id number] DOB: 17-Oct-1990  PCP: Mauricio Po, FNP  Subjective:  Pt is a 25 year old female who presents to clinic for nasal congestion, sore throat and facial pain x four days.  + nasal drainage, cough, sore throat, sneezing. Cough is worse at night. Waking up at night with dry painful throat. No chest congestion.  No history of asthma or allergies.   She has tried Mucinex sinus max, alkaseltzer. She has put a humidifier in her room at night.  Denies fever, chills, chest congestion, chest pain, nausea, vomiting, headache, SOB, wheezing, palpitations.   Review of Systems  Constitutional: Negative for chills, diaphoresis, fatigue and fever.  HENT: Positive for congestion. Negative for postnasal drip, rhinorrhea, sinus pressure, sneezing and sore throat.   Respiratory: Positive for cough. Negative for chest tightness, shortness of breath and wheezing.   Cardiovascular: Negative for chest pain and palpitations.  Gastrointestinal: Negative for abdominal pain, diarrhea, nausea and vomiting.  Neurological: Negative for weakness, light-headedness and headaches.    Patient Active Problem List   Diagnosis Date Noted  . Skin tag of labia 12/31/2015  . Acute upper respiratory infection 07/29/2015  . Routine general medical examination at a health care facility 07/09/2015  . Acute maxillary sinusitis 06/15/2015    Current Outpatient Prescriptions on File Prior to Visit  Medication Sig Dispense Refill  . JUNEL FE 1/20 1-20 MG-MCG tablet   1  . propranolol (INDERAL) 20 MG tablet   1  . chlorpheniramine-HYDROcodone (TUSSIONEX PENNKINETIC ER) 10-8 MG/5ML SUER Take 5 mLs by mouth at bedtime as needed. (Patient not taking: Reported on 07/01/2016) 115 mL 0  . doxycycline (VIBRA-TABS) 100 MG tablet Take 1 tablet (100 mg total) by mouth 2 (two) times daily. (Patient not taking: Reported on 07/01/2016) 20 tablet 0  . hydrocortisone (ANUSOL-HC) 2.5 % rectal cream Place 1  application rectally 2 (two) times daily. (Patient not taking: Reported on 07/01/2016) 30 g 0   No current facility-administered medications on file prior to visit.     Allergies  Allergen Reactions  . Erythromycin Nausea And Vomiting  . Penicillins Rash     Objective:  BP 114/72   Pulse 80   Temp 98.5 F (36.9 C) (Oral)   Resp 16   Ht 5\' 7"  (1.702 m)   Wt 159 lb 3.2 oz (72.2 kg)   SpO2 99%   BMI 24.93 kg/m   Physical Exam  Constitutional: She is oriented to person, place, and time and well-developed, well-nourished, and in no distress. No distress.  HENT:  Right Ear: Tympanic membrane normal.  Left Ear: Tympanic membrane normal.  Nose: Mucosal edema present. No rhinorrhea.  Mouth/Throat: Oropharynx is clear and moist and mucous membranes are normal.  Cardiovascular: Normal rate, regular rhythm and normal heart sounds.   Pulmonary/Chest: Effort normal and breath sounds normal. No respiratory distress.  Neurological: She is alert and oriented to person, place, and time. GCS score is 15.  Skin: Skin is warm and dry.  Psychiatric: Mood, memory, affect and judgment normal.  Vitals reviewed.   Assessment and Plan :  1. Viral illness 2. Cough 3. Nasal congestion - HYDROcodone-homatropine (HYCODAN) 5-1.5 MG/5ML syrup; Take 5 mLs by mouth every 8 (eight) hours as needed for cough.  Dispense: 120 mL; Refill: 0 - Dextromethorphan-Guaifenesin (MUCINEX DM MAXIMUM STRENGTH) 60-1200 MG TB12; Take 1 tablet by mouth every 12 (twelve) hours.  Dispense: 14 each; Refill: 1 - fluticasone (FLONASE) 50 MCG/ACT nasal spray; Place 2 sprays  into both nostrils daily.  Dispense: 16 g; Refill: 6 - Supportive care: Push fluids, rest, salt water gargles. RTC in 5-7 days if no improvement.   Mercer Pod, PA-C  [facility] 07/01/2016 3:41 PM

## 2016-07-04 ENCOUNTER — Telehealth: Payer: Self-pay | Admitting: Family

## 2016-07-04 NOTE — Telephone Encounter (Signed)
Patient states need letter to send to [employer] to appeal short term disability qualifications.

## 2016-07-11 ENCOUNTER — Encounter: Payer: Self-pay | Admitting: Family

## 2016-07-17 NOTE — Telephone Encounter (Signed)
Done

## 2016-09-21 ENCOUNTER — Ambulatory Visit (INDEPENDENT_AMBULATORY_CARE_PROVIDER_SITE_OTHER): Payer: 59

## 2016-09-21 ENCOUNTER — Encounter: Payer: Self-pay | Admitting: Physician Assistant

## 2016-09-21 ENCOUNTER — Ambulatory Visit (INDEPENDENT_AMBULATORY_CARE_PROVIDER_SITE_OTHER): Payer: 59 | Admitting: Physician Assistant

## 2016-09-21 VITALS — BP 156/100 | HR 105 | Temp 98.8°F | Ht 67.0 in | Wt 156.2 lb

## 2016-09-21 DIAGNOSIS — R0789 Other chest pain: Secondary | ICD-10-CM

## 2016-09-21 DIAGNOSIS — F419 Anxiety disorder, unspecified: Secondary | ICD-10-CM

## 2016-09-21 DIAGNOSIS — R0602 Shortness of breath: Secondary | ICD-10-CM

## 2016-09-21 LAB — COMPREHENSIVE METABOLIC PANEL
ALBUMIN: 4.5 g/dL (ref 3.5–5.2)
ALK PHOS: 52 U/L (ref 39–117)
ALT: 12 U/L (ref 0–35)
AST: 13 U/L (ref 0–37)
BILIRUBIN TOTAL: 0.4 mg/dL (ref 0.2–1.2)
BUN: 12 mg/dL (ref 6–23)
CO2: 27 mEq/L (ref 19–32)
Calcium: 9.8 mg/dL (ref 8.4–10.5)
Chloride: 105 mEq/L (ref 96–112)
Creatinine, Ser: 0.77 mg/dL (ref 0.40–1.20)
GFR: 96.76 mL/min (ref >60.00)
GLUCOSE: 85 mg/dL (ref 70–99)
Potassium: 4.4 mEq/L (ref 3.5–5.1)
Sodium: 139 mEq/L (ref 135–145)
TOTAL PROTEIN: 7.6 g/dL (ref 6.0–8.3)

## 2016-09-21 LAB — CBC WITH DIFFERENTIAL/PLATELET
BASOS PCT: 0.9 % (ref 0.0–3.0)
Basophils Absolute: 0.1 10*3/uL (ref 0.0–0.1)
EOS PCT: 1.3 % (ref 0.0–5.0)
Eosinophils Absolute: 0.1 10*3/uL (ref 0.0–0.7)
HEMATOCRIT: 41.7 % (ref 36.0–46.0)
HEMOGLOBIN: 14 g/dL (ref 12.0–15.0)
LYMPHS PCT: 24.9 % (ref 12.0–46.0)
Lymphs Abs: 1.9 10*3/uL (ref 0.7–4.0)
MCHC: 33.6 g/dL (ref 30.0–36.0)
MCV: 86.7 fl (ref 78.0–100.0)
Monocytes Absolute: 0.6 10*3/uL (ref 0.1–1.0)
Monocytes Relative: 7.7 % (ref 3.0–12.0)
NEUTROS PCT: 65.2 % (ref 43.0–77.0)
Neutro Abs: 5.1 10*3/uL (ref 1.4–7.7)
Platelets: 257 10*3/uL (ref 150.0–400.0)
RBC: 4.81 Mil/uL (ref 3.87–5.11)
RDW: 13.9 % (ref 11.5–15.5)
WBC: 7.8 10*3/uL (ref 4.0–10.5)

## 2016-09-21 LAB — TSH: TSH: 0.95 u[IU]/mL (ref 0.35–4.50)

## 2016-09-21 LAB — MAGNESIUM: MAGNESIUM: 2.2 mg/dL (ref 1.5–2.5)

## 2016-09-21 LAB — T4, FREE: FREE T4: 1.14 ng/dL (ref 0.60–1.60)

## 2016-09-21 MED ORDER — CLONAZEPAM 0.5 MG PO TABS: 0.5000 mg | ORAL_TABLET | Freq: Two times a day (BID) | ORAL | 0 refills | Status: DC | PRN

## 2016-09-21 NOTE — Patient Instructions (Signed)
It was great seeing you today!  Please consider starting lexapro or zoloft for your anxiety, I would be happy to help you start this to help try to keep your anxiety better controlled.  Any changes in chest tightness or SOB --> please go to the emergency room!  Generalized Anxiety Disorder, Adult Generalized anxiety disorder (GAD) is a mental health disorder. People with this condition constantly worry about everyday events. Unlike normal anxiety, worry related to GAD is not triggered by a specific event. These worries also do not fade or get better with time. GAD interferes with life functions, including relationships, work, and school. GAD can vary from mild to severe. People with severe GAD can have intense waves of anxiety with physical symptoms (panic attacks). What are the causes? The exact cause of GAD is not known. What increases the risk? This condition is more likely to develop in:  Women.  People who have a family history of anxiety disorders.  People who are very shy.  People who experience very stressful life events, such as the death of a loved one.  People who have a very stressful family environment. What are the signs or symptoms? People with GAD often worry excessively about many things in their lives, such as their health and family. They may also be overly concerned about:  Doing well at work.  Being on time.  Natural disasters.  Friendships. Physical symptoms of GAD include:  Fatigue.  Muscle tension or having muscle twitches.  Trembling or feeling shaky.  Being easily startled.  Feeling like your heart is pounding or racing.  Feeling out of breath or like you cannot take a deep breath.  Having trouble falling asleep or staying asleep.  Sweating.  Nausea, diarrhea, or irritable bowel syndrome (IBS).  Headaches.  Trouble concentrating or remembering facts.  Restlessness.  Irritability. How is this diagnosed? Your health care provider  can diagnose GAD based on your symptoms and medical history. You will also have a physical exam. The health care provider will ask specific questions about your symptoms, including how severe they are, when they started, and if they come and go. Your health care provider may ask you about your use of alcohol or drugs, including prescription medicines. Your health care provider may refer you to a mental health specialist for further evaluation. Your health care provider will do a thorough examination and may perform additional tests to rule out other possible causes of your symptoms. To be diagnosed with GAD, a person must have anxiety that:  Is out of his or her control.  Affects several different aspects of his or her life, such as work and relationships.  Causes distress that makes him or her unable to take part in normal activities.  Includes at least three physical symptoms of GAD, such as restlessness, fatigue, trouble concentrating, irritability, muscle tension, or sleep problems. Before your health care provider can confirm a diagnosis of GAD, these symptoms must be present more days than they are not, and they must last for six months or longer. How is this treated? The following therapies are usually used to treat GAD:  Medicine. Antidepressant medicine is usually prescribed for long-term daily control. Antianxiety medicines may be added in severe cases, especially when panic attacks occur.  Talk therapy (psychotherapy). Certain types of talk therapy can be helpful in treating GAD by providing support, education, and guidance. Options include:  Cognitive behavioral therapy (CBT). People learn coping skills and techniques to ease their anxiety. They learn  to identify unrealistic or negative thoughts and behaviors and to replace them with positive ones.  Acceptance and commitment therapy (ACT). This treatment teaches people how to be mindful as a way to cope with unwanted thoughts and  feelings.  Biofeedback. This process trains you to manage your body's response (physiological response) through breathing techniques and relaxation methods. You will work with a therapist while machines are used to monitor your physical symptoms.  Stress management techniques. These include yoga, meditation, and exercise. A mental health specialist can help determine which treatment is best for you. Some people see improvement with one type of therapy. However, other people require a combination of therapies. Follow these instructions at home:  Take over-the-counter and prescription medicines only as told by your health care provider.  Try to maintain a normal routine.  Try to anticipate stressful situations and allow extra time to manage them.  Practice any stress management or self-calming techniques as taught by your health care provider.  Do not punish yourself for setbacks or for not making progress.  Try to recognize your accomplishments, even if they are small.  Keep all follow-up visits as told by your health care provider. This is important. Contact a health care provider if:  Your symptoms do not get better.  Your symptoms get worse.  You have signs of depression, such as:  A persistently sad, cranky, or irritable mood.  Loss of enjoyment in activities that used to bring you joy.  Change in weight or eating.  Changes in sleeping habits.  Avoiding friends or family members.  Loss of energy for normal tasks.  Feelings of guilt or worthlessness. Get help right away if:  You have serious thoughts about hurting yourself or others. If you ever feel like you may hurt yourself or others, or have thoughts about taking your own life, get help right away. You can go to your nearest emergency department or call:  Your local emergency services (911 in the [place]  A suicide crisis helpline, such as the [place] at 5204226896. This is open  24 hours a day. Summary  Generalized anxiety disorder (GAD) is a mental health disorder that involves worry that is not triggered by a specific event.  People with GAD often worry excessively about many things in their lives, such as their health and family.  GAD may cause physical symptoms such as restlessness, trouble concentrating, sleep problems, frequent sweating, nausea, diarrhea, headaches, and trembling or muscle twitching.  A mental health specialist can help determine which treatment is best for you. Some people see improvement with one type of therapy. However, other people require a combination of therapies. This information is not intended to replace advice given to you by your health care provider. Make sure you discuss any questions you have with your health care provider. Document Released: 10/14/2012 Document Revised: 05/09/2016 Document Reviewed: 05/09/2016 Elsevier Interactive Patient Education  2017 [employer].

## 2016-09-21 NOTE — Progress Notes (Signed)
Pre visit review using our clinic review tool, if applicable. No additional management support is needed unless otherwise documented below in the visit note. 

## 2016-09-21 NOTE — Progress Notes (Addendum)
Erin Byrd is a 26 y.o. female here for a new problem.   History of Present Illness:   Chief Complaint  Patient presents with  . Chest Pain  . Shortness of Breath  . Dizziness  . anxious    HPI   Patient reports that over the past few days, she has had some intermittent chest tightness, shortness of breath, lightheadedness and episodes of anxiety. She states that she has had some increasing anxiety over the past few months, as she has started a new job, is looking for a new house, and is planning a wedding. She endorses poor sleep secondary to anxiety. She denies cough, fever, calf pain, change in appetite. Last period was 09/11/16. Denies excessive caffeine intake, drinks approximately 1 glass of iced tea daily, doesn't drink coffee or energy drinks. She has a prescription for propranolol for benign essential tremor, which she rarely takes, she reserves this for when she is out in public in front of other people. She denies any personal history of blood clots. She is overdue for her yearly physical. She does report that she doesn't take time for herself and doesn't really do anything to help "decompress."  GAD-7 score today is 6  PMHx, SurgHx, SocialHx, Medications, and Allergies were reviewed in the Visit Navigator and updated as appropriate.  Current Medications:   Current Outpatient Prescriptions:  .  JUNEL FE 1/20 1-20 MG-MCG tablet, , Disp: , Rfl: 1 .  propranolol (INDERAL) 20 MG tablet, , Disp: , Rfl: 1 .  clonazePAM (KLONOPIN) 0.5 MG tablet, Take 1 tablet (0.5 mg total) by mouth 2 (two) times daily as needed for anxiety., Disp: 20 tablet, Rfl: 0   Review of Systems:   Review of Systems  Constitutional: Positive for malaise/fatigue. Negative for chills, fever and weight loss.  Respiratory: Positive for shortness of breath. Negative for cough, hemoptysis and sputum production.   Cardiovascular: Positive for chest pain (tightness). Negative for palpitations, orthopnea,  claudication, leg swelling and PND.  Neurological: Positive for tremors (hx of benign essential tremor).  Psychiatric/Behavioral: The patient is nervous/anxious.       Vitals:   Vitals:   09/21/16 0804  BP: (!) 156/100  Pulse: (!) 105  Temp: 98.8 F (37.1 C)  TempSrc: Oral  SpO2: 98%  Weight: 156 lb 4 oz (70.9 kg)  Height: 5\' 7"  (1.702 m)     Repeat BP was 130/90, pulse 90.  Body mass index is 24.47 kg/m.  Physical Exam:   Physical Exam  Constitutional: She appears well-developed. She is cooperative.  Non-toxic appearance. She does not have a sickly appearance. She does not appear ill. No distress.  Cardiovascular: Normal rate, regular rhythm, S1 normal, S2 normal, normal heart sounds and normal pulses.   No LE edema  Pulmonary/Chest: Effort normal and breath sounds normal.  Neurological: She is alert.  Nursing note and vitals reviewed.   EKG tracing is personally reviewed.  EKG notes NSR.  No acute changes.   Assessment and Plan:    Erin Byrd was seen today for chest pain, shortness of breath, dizziness and anxious.  Diagnoses and all orders for this visit:  Chest tightness -     EKG 12-Lead -     CBC with Differential/Platelet -     Comprehensive metabolic panel -     T4, free -     TSH -     Magnesium -     DG Chest 2 View; Future  SOB (shortness of breath) -  EKG 12-Lead -     CBC with Differential/Platelet -     Comprehensive metabolic panel -     T4, free -     TSH -     Magnesium -     DG Chest 2 View; Future  Anxiety  Other orders -     clonazePAM (KLONOPIN) 0.5 MG tablet; Take 1 tablet (0.5 mg total) by mouth 2 (two) times daily as needed for anxiety.   EKG without acute abnormalities. Will order stat chest xray and labs to rule out any organic cause. Suspect that this is secondary to anxiety. Discussed SSRI vs therapy vs scheduled propranolol; patient will consider these options. I have given her klonopin 0.5 mg #20 to help her in the  interim, I discussed with her that this will not fix her anxiety, but will help her cope until she is able to research and decide which intervention she would like to pursue, she is agreeable to plan. She is overdue for a physical, I recommended that she follow-up with PCP soon to re-evaluate her anxiety and complete routine screening. She is agreeable to plan. Advised patient that if her chest tightness or SOB changes in any way, I would like for her to go to the ER.    . Reviewed expectations re: course of current medical issues. . Discussed self-management of symptoms. . Outlined signs and symptoms indicating need for more acute intervention. . Patient verbalized understanding and all questions were answered. . See orders for this visit as documented in the electronic medical record. . Patient received an After-Visit Summary.   Inda Coke, PA-C

## 2016-10-12 ENCOUNTER — Other Ambulatory Visit: Payer: Self-pay | Admitting: Physician Assistant

## 2016-10-12 MED ORDER — ESCITALOPRAM OXALATE 10 MG PO TABS: 10.0000 mg | ORAL_TABLET | Freq: Every day | ORAL | 1 refills | Status: DC

## 2016-10-12 NOTE — Progress Notes (Signed)
Follow-up with patient regarding her anxiety, she would like to start Lexapro. I have sent this in for patient to try. She will follow-up with [place] and let us know if this medication is working for her.

## 2016-11-01 DIAGNOSIS — D225 Melanocytic nevi of trunk: Secondary | ICD-10-CM | POA: Diagnosis not present

## 2016-11-14 ENCOUNTER — Other Ambulatory Visit: Payer: Self-pay | Admitting: Physician Assistant

## 2016-11-14 MED ORDER — TRIAMCINOLONE ACETONIDE 0.1 % EX CREA: 1.0000 "application " | TOPICAL_CREAM | Freq: Two times a day (BID) | CUTANEOUS | 0 refills | Status: DC

## 2016-11-15 ENCOUNTER — Observation Stay (HOSPITAL_COMMUNITY): Payer: 59 | Admitting: Anesthesiology

## 2016-11-15 ENCOUNTER — Encounter (HOSPITAL_COMMUNITY)
Admission: EM | Disposition: A | Discharge status: Home / Self Care | Payer: Self-pay | Source: Home / Self Care | Attending: Emergency Medicine

## 2016-11-15 ENCOUNTER — Observation Stay (HOSPITAL_BASED_OUTPATIENT_CLINIC_OR_DEPARTMENT_OTHER)
Admission: EM | Admit: 2016-11-15 | Discharge: 2016-11-16 | Disposition: A | Discharge status: Home / Self Care | Payer: 59 | Attending: Surgery | Admitting: Surgery

## 2016-11-15 ENCOUNTER — Emergency Department (HOSPITAL_BASED_OUTPATIENT_CLINIC_OR_DEPARTMENT_OTHER): Payer: 59

## 2016-11-15 ENCOUNTER — Encounter (HOSPITAL_BASED_OUTPATIENT_CLINIC_OR_DEPARTMENT_OTHER): Payer: Self-pay | Admitting: *Deleted

## 2016-11-15 DIAGNOSIS — Z79899 Other long term (current) drug therapy: Secondary | ICD-10-CM | POA: Diagnosis not present

## 2016-11-15 DIAGNOSIS — R1031 Right lower quadrant pain: Secondary | ICD-10-CM | POA: Diagnosis not present

## 2016-11-15 DIAGNOSIS — Z880 Allergy status to penicillin: Secondary | ICD-10-CM | POA: Insufficient documentation

## 2016-11-15 DIAGNOSIS — K3580 Unspecified acute appendicitis: Principal | ICD-10-CM | POA: Diagnosis present

## 2016-11-15 HISTORY — PX: LAPAROSCOPIC APPENDECTOMY: SHX408

## 2016-11-15 LAB — COMPREHENSIVE METABOLIC PANEL
ALK PHOS: 53 U/L (ref 38–126)
ALT: 14 U/L (ref 14–54)
ANION GAP: 9 (ref 5–15)
AST: 17 U/L (ref 15–41)
Albumin: 4.2 g/dL (ref 3.5–5.0)
BILIRUBIN TOTAL: 0.6 mg/dL (ref 0.3–1.2)
BUN: 12 mg/dL (ref 6–20)
CALCIUM: 9.7 mg/dL (ref 8.9–10.3)
CO2: 24 mmol/L (ref 22–32)
CREATININE: 0.71 mg/dL (ref 0.44–1.00)
Chloride: 103 mmol/L (ref 101–111)
Glucose, Bld: 97 mg/dL (ref 65–99)
Potassium: 3.5 mmol/L (ref 3.5–5.1)
Sodium: 136 mmol/L (ref 135–145)
TOTAL PROTEIN: 7.3 g/dL (ref 6.5–8.1)

## 2016-11-15 LAB — CBC WITH DIFFERENTIAL/PLATELET
Basophils Absolute: 0 10*3/uL (ref 0.0–0.1)
Basophils Relative: 0 %
Eosinophils Absolute: 0.1 10*3/uL (ref 0.0–0.7)
Eosinophils Relative: 1 %
HCT: 39.5 % (ref 36.0–46.0)
HEMOGLOBIN: 13.6 g/dL (ref 12.0–15.0)
LYMPHS ABS: 3.6 10*3/uL (ref 0.7–4.0)
LYMPHS PCT: 23 %
MCH: 29.6 pg (ref 26.0–34.0)
MCHC: 34.4 g/dL (ref 30.0–36.0)
MCV: 86.1 fL (ref 78.0–100.0)
MONOS PCT: 8 %
Monocytes Absolute: 1.3 10*3/uL — ABNORMAL HIGH (ref 0.1–1.0)
NEUTROS PCT: 68 %
Neutro Abs: 10.5 10*3/uL — ABNORMAL HIGH (ref 1.7–7.7)
Platelets: 213 10*3/uL (ref 150–400)
RBC: 4.59 MIL/uL (ref 3.87–5.11)
RDW: 14 % (ref 11.5–15.5)
WBC: 15.4 10*3/uL — AB (ref 4.0–10.5)

## 2016-11-15 LAB — URINALYSIS, ROUTINE W REFLEX MICROSCOPIC
BILIRUBIN URINE: NEGATIVE
Glucose, UA: NEGATIVE mg/dL
Hgb urine dipstick: NEGATIVE
Ketones, ur: NEGATIVE mg/dL
Leukocytes, UA: NEGATIVE
NITRITE: NEGATIVE
PH: 6 (ref 5.0–8.0)
Protein, ur: NEGATIVE mg/dL
SPECIFIC GRAVITY, URINE: 1.021 (ref 1.005–1.030)

## 2016-11-15 LAB — SURGICAL PCR SCREEN
MRSA, PCR: NEGATIVE
Staphylococcus aureus: POSITIVE — AB

## 2016-11-15 LAB — WET PREP, GENITAL
Clue Cells Wet Prep HPF POC: NONE SEEN
SPERM: NONE SEEN
Trich, Wet Prep: NONE SEEN
YEAST WET PREP: NONE SEEN

## 2016-11-15 LAB — PREGNANCY, URINE: Preg Test, Ur: NEGATIVE

## 2016-11-15 LAB — HIV ANTIBODY (ROUTINE TESTING W REFLEX): HIV Screen 4th Generation wRfx: NONREACTIVE

## 2016-11-15 LAB — LIPASE, BLOOD: LIPASE: 27 U/L (ref 11–51)

## 2016-11-15 SURGERY — APPENDECTOMY, LAPAROSCOPIC
Anesthesia: General | Site: Abdomen

## 2016-11-15 MED ORDER — DEXAMETHASONE SODIUM PHOSPHATE 10 MG/ML IJ SOLN
INTRAMUSCULAR | Status: AC
  Filled 2016-11-15: qty 1

## 2016-11-15 MED ORDER — GLYCOPYRROLATE 0.2 MG/ML IJ SOLN
INTRAMUSCULAR | Status: DC | PRN
  Administered 2016-11-15: 0.4 mg via INTRAVENOUS

## 2016-11-15 MED ORDER — NEOSTIGMINE METHYLSULFATE 10 MG/10ML IV SOLN
INTRAVENOUS | Status: DC | PRN
  Administered 2016-11-15: 3 mg via INTRAVENOUS

## 2016-11-15 MED ORDER — PROPOFOL 10 MG/ML IV BOLUS
INTRAVENOUS | Status: DC | PRN
  Administered 2016-11-15: 160 mg via INTRAVENOUS

## 2016-11-15 MED ORDER — FENTANYL CITRATE (PF) 100 MCG/2ML IJ SOLN
100.0000 ug | Freq: Once | INTRAMUSCULAR | Status: AC
  Administered 2016-11-15: 25 ug via INTRAVENOUS

## 2016-11-15 MED ORDER — METRONIDAZOLE IN NACL 5-0.79 MG/ML-% IV SOLN
500.0000 mg | Freq: Three times a day (TID) | INTRAVENOUS | Status: DC
  Administered 2016-11-15 – 2016-11-16 (×3): 500 mg via INTRAVENOUS
  Filled 2016-11-15 (×2): qty 100

## 2016-11-15 MED ORDER — LIDOCAINE 2% (20 MG/ML) 5 ML SYRINGE
INTRAMUSCULAR | Status: AC
  Filled 2016-11-15: qty 5

## 2016-11-15 MED ORDER — CEFTRIAXONE SODIUM 2 G IJ SOLR
2.0000 g | Freq: Once | INTRAMUSCULAR | Status: AC
  Administered 2016-11-15: 2 g via INTRAVENOUS
  Filled 2016-11-15: qty 2

## 2016-11-15 MED ORDER — DIPHENHYDRAMINE HCL 25 MG PO CAPS: 25.0000 mg | ORAL_CAPSULE | Freq: Four times a day (QID) | ORAL | Status: DC | PRN

## 2016-11-15 MED ORDER — HYDROMORPHONE HCL 1 MG/ML IJ SOLN: 1.0000 mg | INTRAMUSCULAR | Status: DC | PRN

## 2016-11-15 MED ORDER — HYDROCODONE-ACETAMINOPHEN 5-325 MG PO TABS
1.0000 | ORAL_TABLET | ORAL | Status: DC | PRN
  Administered 2016-11-15 (×2): 1 via ORAL
  Filled 2016-11-15 (×2): qty 1

## 2016-11-15 MED ORDER — METRONIDAZOLE IN NACL 5-0.79 MG/ML-% IV SOLN: 500.0000 mg | Freq: Three times a day (TID) | INTRAVENOUS | Status: DC

## 2016-11-15 MED ORDER — METRONIDAZOLE IN NACL 5-0.79 MG/ML-% IV SOLN
INTRAVENOUS | Status: AC
  Filled 2016-11-15: qty 100

## 2016-11-15 MED ORDER — ONDANSETRON 4 MG PO TBDP: 4.0000 mg | ORAL_TABLET | Freq: Four times a day (QID) | ORAL | Status: DC | PRN

## 2016-11-15 MED ORDER — GLYCOPYRROLATE 0.2 MG/ML IV SOSY
PREFILLED_SYRINGE | INTRAVENOUS | Status: AC
  Filled 2016-11-15: qty 5

## 2016-11-15 MED ORDER — HYDROMORPHONE HCL 1 MG/ML IJ SOLN
0.2500 mg | INTRAMUSCULAR | Status: DC | PRN
  Administered 2016-11-15 (×2): 0.5 mg via INTRAVENOUS

## 2016-11-15 MED ORDER — PROPOFOL 10 MG/ML IV BOLUS
INTRAVENOUS | Status: AC
  Filled 2016-11-15: qty 20

## 2016-11-15 MED ORDER — DIPHENHYDRAMINE HCL 50 MG/ML IJ SOLN: 25.0000 mg | Freq: Four times a day (QID) | INTRAMUSCULAR | Status: DC | PRN

## 2016-11-15 MED ORDER — HYDROMORPHONE HCL 1 MG/ML IJ SOLN
INTRAMUSCULAR | Status: AC
  Administered 2016-11-15: 0.5 mg via INTRAVENOUS
  Filled 2016-11-15: qty 1

## 2016-11-15 MED ORDER — BUPIVACAINE HCL (PF) 0.25 % IJ SOLN
INTRAMUSCULAR | Status: DC | PRN
  Administered 2016-11-15: 20 mL

## 2016-11-15 MED ORDER — ONDANSETRON HCL 4 MG/2ML IJ SOLN
INTRAMUSCULAR | Status: AC
  Filled 2016-11-15: qty 2

## 2016-11-15 MED ORDER — CIPROFLOXACIN IN D5W 400 MG/200ML IV SOLN: 400.0000 mg | Freq: Two times a day (BID) | INTRAVENOUS | Status: DC

## 2016-11-15 MED ORDER — 0.9 % SODIUM CHLORIDE (POUR BTL) OPTIME
TOPICAL | Status: DC | PRN
  Administered 2016-11-15: 1000 mL

## 2016-11-15 MED ORDER — LIDOCAINE 2% (20 MG/ML) 5 ML SYRINGE
INTRAMUSCULAR | Status: DC | PRN
  Administered 2016-11-15: 100 mg via INTRAVENOUS

## 2016-11-15 MED ORDER — ONDANSETRON HCL 4 MG/2ML IJ SOLN
4.0000 mg | Freq: Four times a day (QID) | INTRAMUSCULAR | Status: DC | PRN
Start: 2016-11-15 — End: 2016-11-16

## 2016-11-15 MED ORDER — FENTANYL CITRATE (PF) 100 MCG/2ML IJ SOLN
INTRAMUSCULAR | Status: DC | PRN
  Administered 2016-11-15 (×2): 50 ug via INTRAVENOUS

## 2016-11-15 MED ORDER — SUCCINYLCHOLINE CHLORIDE 200 MG/10ML IV SOSY
PREFILLED_SYRINGE | INTRAVENOUS | Status: AC
  Filled 2016-11-15: qty 10

## 2016-11-15 MED ORDER — METOPROLOL TARTRATE 5 MG/5ML IV SOLN
2.5000 mg | Freq: Four times a day (QID) | INTRAVENOUS | Status: DC
  Administered 2016-11-15: 2.5 mg via INTRAVENOUS
  Filled 2016-11-15: qty 5

## 2016-11-15 MED ORDER — PROMETHAZINE HCL 25 MG/ML IJ SOLN: 6.2500 mg | INTRAMUSCULAR | Status: DC | PRN

## 2016-11-15 MED ORDER — MORPHINE SULFATE (PF) 4 MG/ML IV SOLN: 1.0000 mg | INTRAVENOUS | Status: DC | PRN

## 2016-11-15 MED ORDER — ROCURONIUM BROMIDE 10 MG/ML (PF) SYRINGE
PREFILLED_SYRINGE | INTRAVENOUS | Status: DC | PRN
  Administered 2016-11-15: 30 mg via INTRAVENOUS

## 2016-11-15 MED ORDER — METRONIDAZOLE IN NACL 5-0.79 MG/ML-% IV SOLN
500.0000 mg | Freq: Once | INTRAVENOUS | Status: AC
  Administered 2016-11-15: 500 mg via INTRAVENOUS
  Filled 2016-11-15: qty 100

## 2016-11-15 MED ORDER — MIDAZOLAM HCL 2 MG/2ML IJ SOLN
INTRAMUSCULAR | Status: AC
  Filled 2016-11-15: qty 2

## 2016-11-15 MED ORDER — BUPIVACAINE HCL (PF) 0.25 % IJ SOLN
INTRAMUSCULAR | Status: AC
  Filled 2016-11-15: qty 30

## 2016-11-15 MED ORDER — LACTATED RINGERS IR SOLN
Status: DC | PRN
  Administered 2016-11-15: 1000 mL

## 2016-11-15 MED ORDER — FENTANYL CITRATE (PF) 100 MCG/2ML IJ SOLN
INTRAMUSCULAR | Status: AC
  Filled 2016-11-15: qty 2

## 2016-11-15 MED ORDER — NEOSTIGMINE METHYLSULFATE 5 MG/5ML IV SOSY
PREFILLED_SYRINGE | INTRAVENOUS | Status: AC
  Filled 2016-11-15: qty 5

## 2016-11-15 MED ORDER — ROCURONIUM BROMIDE 50 MG/5ML IV SOSY
PREFILLED_SYRINGE | INTRAVENOUS | Status: AC
  Filled 2016-11-15: qty 5

## 2016-11-15 MED ORDER — FENTANYL CITRATE (PF) 100 MCG/2ML IJ SOLN
INTRAMUSCULAR | Status: AC
  Administered 2016-11-15: 25 ug
  Filled 2016-11-15: qty 2

## 2016-11-15 MED ORDER — ONDANSETRON HCL 4 MG/2ML IJ SOLN: 4.0000 mg | Freq: Four times a day (QID) | INTRAMUSCULAR | Status: DC | PRN

## 2016-11-15 MED ORDER — ACETAMINOPHEN 325 MG PO TABS
650.0000 mg | ORAL_TABLET | Freq: Four times a day (QID) | ORAL | Status: DC | PRN
  Administered 2016-11-16: 650 mg via ORAL
  Filled 2016-11-15: qty 2

## 2016-11-15 MED ORDER — LACTATED RINGERS IV SOLN
INTRAVENOUS | Status: DC | PRN
  Administered 2016-11-15 (×2): via INTRAVENOUS

## 2016-11-15 MED ORDER — ACETAMINOPHEN 650 MG RE SUPP: 650.0000 mg | Freq: Four times a day (QID) | RECTAL | Status: DC | PRN

## 2016-11-15 MED ORDER — MIDAZOLAM HCL 5 MG/5ML IJ SOLN
INTRAMUSCULAR | Status: DC | PRN
  Administered 2016-11-15: 2 mg via INTRAVENOUS

## 2016-11-15 MED ORDER — DEXAMETHASONE SODIUM PHOSPHATE 10 MG/ML IJ SOLN
INTRAMUSCULAR | Status: DC | PRN
  Administered 2016-11-15: 10 mg via INTRAVENOUS

## 2016-11-15 MED ORDER — IOPAMIDOL (ISOVUE-300) INJECTION 61%
100.0000 mL | Freq: Once | INTRAVENOUS | Status: AC | PRN
  Administered 2016-11-15: 100 mL via INTRAVENOUS

## 2016-11-15 MED ORDER — SUCCINYLCHOLINE CHLORIDE 200 MG/10ML IV SOSY
PREFILLED_SYRINGE | INTRAVENOUS | Status: DC | PRN
  Administered 2016-11-15: 100 mg via INTRAVENOUS

## 2016-11-15 MED ORDER — ONDANSETRON HCL 4 MG/2ML IJ SOLN
INTRAMUSCULAR | Status: DC | PRN
  Administered 2016-11-15: 4 mg via INTRAVENOUS

## 2016-11-15 SURGICAL SUPPLY — 30 items
APPLIER CLIP ROT 10 11.4 M/L (STAPLE)
CHLORAPREP W/TINT 26ML (MISCELLANEOUS) ×2 IMPLANT
CLIP APPLIE ROT 10 11.4 M/L (STAPLE) IMPLANT
CUTTER FLEX LINEAR 45M (STAPLE) ×2 IMPLANT
DECANTER SPIKE VIAL GLASS SM (MISCELLANEOUS) IMPLANT
DRAPE LAPAROSCOPIC ABDOMINAL (DRAPES) ×2 IMPLANT
ELECT REM PT RETURN 15FT ADLT (MISCELLANEOUS) ×2 IMPLANT
ENDOLOOP SUT PDS II  0 18 (SUTURE)
ENDOLOOP SUT PDS II 0 18 (SUTURE) IMPLANT
GAUZE SPONGE 2X2 8PLY STRL LF (GAUZE/BANDAGES/DRESSINGS) ×1 IMPLANT
GLOVE SURG ORTHO 8.0 STRL STRW (GLOVE) ×2 IMPLANT
GOWN STRL REUS W/TWL XL LVL3 (GOWN DISPOSABLE) ×4 IMPLANT
IRRIG SUCT STRYKERFLOW 2 WTIP (MISCELLANEOUS) ×2
IRRIGATION SUCT STRKRFLW 2 WTP (MISCELLANEOUS) ×1 IMPLANT
KIT BASIN OR (CUSTOM PROCEDURE TRAY) ×2 IMPLANT
POUCH SPECIMEN RETRIEVAL 10MM (ENDOMECHANICALS) ×2 IMPLANT
RELOAD 45 VASCULAR/THIN (ENDOMECHANICALS) IMPLANT
RELOAD STAPLE TA45 3.5 REG BLU (ENDOMECHANICALS) ×2 IMPLANT
SHEARS HARMONIC ACE PLUS 36CM (ENDOMECHANICALS) ×2 IMPLANT
SPONGE GAUZE 2X2 STER 10/PKG (GAUZE/BANDAGES/DRESSINGS) ×1
STRIP CLOSURE SKIN 1/2X4 (GAUZE/BANDAGES/DRESSINGS) ×2 IMPLANT
SUT MNCRL AB 4-0 PS2 18 (SUTURE) ×2 IMPLANT
TAPE CLOTH SURG 4X10 WHT LF (GAUZE/BANDAGES/DRESSINGS) ×2 IMPLANT
TOWEL OR 17X26 10 PK STRL BLUE (TOWEL DISPOSABLE) ×2 IMPLANT
TOWEL OR NON WOVEN STRL DISP B (DISPOSABLE) ×2 IMPLANT
TRAY FOLEY W/METER SILVER 14FR (SET/KITS/TRAYS/PACK) ×2 IMPLANT
TRAY FOLEY W/METER SILVER 16FR (SET/KITS/TRAYS/PACK) IMPLANT
TRAY LAPAROSCOPIC (CUSTOM PROCEDURE TRAY) ×2 IMPLANT
TROCAR XCEL BLUNT TIP 100MML (ENDOMECHANICALS) ×2 IMPLANT
TROCAR XCEL NON-BLD 11X100MML (ENDOMECHANICALS) ×2 IMPLANT

## 2016-11-15 NOTE — Op Note (Signed)
OPERATIVE REPORT - LAPAROSCOPIC APPENDECTOMY  Preop diagnosis: Acute appendicitis  Postop diagnosis: Same  Procedure: Laparoscopic appendectomy  Surgeon:  Earnstine Regal, MD, FACS  Anesthesia: General endotracheal  Estimated blood loss: Minimal  Preparation: Chlora-prep  Complications: None  Indications:  Patient is a 26 yo WF with a one day history of abdominal pain localizing to the RLQ.  CTA positive for acute appendicitis.  WBC 15K.  Now for appendectomy.  Procedure:  Patient is brought to the operating room and placed in a supine position on the operating room table. Following administration of general anesthesia, a time out was held and the patient's name and procedure is confirmed. Patient is then prepped and draped in the usual strict aseptic fashion.  After ascertaining that an adequate level of anesthesia has been achieved, a peri-umbilical incision is made with a #15 blade. Dissection is carried down to the fascia. Fascia is incised in the midline and the peritoneal cavity is entered cautiously. A #0-vicryl pursestring suture is placed in the fascia. An Hassan cannula is introduced under direct vision and secured with the pursestring suture. The abdomen is insufflated with carbon dioxide. The laparoscope is introduced and the abdomen is explored. Operative ports are placed in the right upper quadrant and left lower quadrant. The appendix is identified. The mesoappendix is divided with the harmonic scalpel. Dissection is carried down to the base of the appendix. The base of the appendix is dissected out clearing the junction with the cecal wall. Using an Endo-GIA stapler, the base of the appendix is transected at the junction with the cecal wall. There is good approximation of tissue along the staple line. There is good hemostasis along the staple line. The appendix is placed into an endo-catch bag and withdrawn through the umbilical port. The #0-vicryl pursestring suture is tied  securely.  Right lower quadrant is irrigated with warm saline which is evacuated. Good hemostasis is noted. Ports are removed under direct vision. Good hemostasis is noted at the port sites. Pneumoperitoneum is released.  Skin incisions are anesthetized with local anesthetic. Wounds are closed with interrupted 4-0 Monocryl subcuticular sutures. Wounds are washed and dried and benzoin and Steri-Strips are applied. Dressings are applied. The patient is awakened from anesthesia and brought to the recovery room. The patient tolerated the procedure well.  Earnstine Regal, MD, [facility] Office: [phone]

## 2016-11-15 NOTE — Anesthesia Preprocedure Evaluation (Addendum)
Anesthesia Evaluation  Patient identified by MRN, date of birth, ID band Patient awake    Reviewed: Allergy & Precautions, NPO status , Patient's Chart, lab work & pertinent test results  Airway Mallampati: I       Dental no notable dental hx.    Pulmonary neg pulmonary ROS,    breath sounds clear to auscultation       Cardiovascular negative cardio ROS   Rhythm:Regular Rate:Normal     Neuro/Psych negative neurological ROS  negative psych ROS   GI/Hepatic Neg liver ROS, Acute appendicitis    Endo/Other  negative endocrine ROS  Renal/GU negative Renal ROS  negative genitourinary   Musculoskeletal negative musculoskeletal ROS (+)   Abdominal   Peds negative pediatric ROS (+)  Hematology negative hematology ROS (+)   Anesthesia Other Findings   Reproductive/Obstetrics negative OB ROS                            Anesthesia Physical Anesthesia Plan  ASA: I  Anesthesia Plan: General   Post-op Pain Management:    Induction: Intravenous  Airway Management Planned: Oral ETT  Additional Equipment:   Intra-op Plan:   Post-operative Plan: Extubation in OR  Informed Consent: I have reviewed the patients History and Physical, chart, labs and discussed the procedure including the risks, benefits and alternatives for the proposed anesthesia with the patient or authorized representative who has indicated his/her understanding and acceptance.   Dental advisory given  Plan Discussed with: CRNA  Anesthesia Plan Comments:         Anesthesia Quick Evaluation

## 2016-11-15 NOTE — Anesthesia Postprocedure Evaluation (Addendum)
Anesthesia Post Note  Patient: Erin Byrd  Procedure(s) Performed: Procedure(s) (LRB): APPENDECTOMY LAPAROSCOPIC (N/A)  Patient location during evaluation: PACU Anesthesia Type: General Level of consciousness: awake and sedated Pain management: pain level controlled Vital Signs Assessment: post-procedure vital signs reviewed and stable Respiratory status: spontaneous breathing, nonlabored ventilation, respiratory function stable and patient connected to nasal cannula oxygen Cardiovascular status: blood pressure returned to baseline and stable Postop Assessment: no signs of nausea or vomiting Anesthetic complications: no       Last Vitals:  Vitals:   11/15/16 1500 11/15/16 1513  BP: 129/71 128/71  Pulse: 81 69  Resp: 18 16  Temp: 36.9 C 36.8 C    Last Pain:  Vitals:   11/15/16 1500  TempSrc:   PainSc: 3                  Stefen Juba,JAMES TERRILL

## 2016-11-15 NOTE — ED Provider Notes (Signed)
[facility] Provider Note: Georgena Spurling, MD, FACEP  CSN: [id number] MRN: [id number] ARRIVAL: 11/15/16 at [place]: [place]  Abdominal Pain   [place]  Erin Byrd is a 26 y.o. female who has had the gradual onset of abdominal pain over the past 12 hours. The pain was initially cramping in nature. It felt like she needed to move her bowels. It was also somewhat similar to menstrual cramps but she is about a week out from her most recent menstrual period. The pain plateaued about 2 hours ago and has been persistent since. She rates it as a 6 out of 10. There is a sharp as well as cramping component to it. The pain is located in the right lower quadrant and is worse with palpation and certain movements. She denies fever but has felt chilled. She denies nausea, vomiting, diarrhea, dysuria, vaginal bleeding or vaginal discharge. She is equivocal about having an appetite currently.   Past Medical History:  Diagnosis Date  . Chicken pox     Past Surgical History:  Procedure Laterality Date  . arm surgery    . WISDOM TOOTH EXTRACTION      Family History  Problem Relation Age of Onset  . Hypertension Mother   . Hypertension Father   . Hypertension Paternal Grandmother   . Hypertension Paternal Grandfather   . Diabetes Paternal Grandfather     Social History  Substance Use Topics  . Smoking status: Never Smoker  . Smokeless tobacco: Never Used  . Alcohol use 1.2 - 1.8 oz/week    2 - 3 Cans of beer per week    Prior to Admission medications   Medication Sig Start Date End Date Taking? Authorizing Provider  JUNEL FE 1/20 1-20 MG-MCG tablet  05/19/15   [provider]  propranolol (INDERAL) 20 MG tablet  03/30/15   [provider]    Allergies Erythromycin and Penicillins   REVIEW OF SYSTEMS  Negative except as noted here or in the History of Present Illness.   PHYSICAL EXAMINATION  Initial Vital  Signs Blood pressure (!) 152/103, pulse (!) 115, temperature 98.5 F (36.9 C), temperature source Oral, resp. rate 18, height 5\' 7"  (1.702 m), weight 150 lb (68 kg), last menstrual period 11/06/2016, SpO2 100 %.  Examination General: Well-developed, well-nourished female in no acute distress; appearance consistent with age of record HENT: normocephalic; atraumatic Eyes: pupils equal, round and reactive to light; extraocular muscles intact Neck: supple Heart: regular rate and rhythm Lungs: clear to auscultation bilaterally Abdomen: soft; nondistended; right lower quadrant tenderness; no masses or hepatosplenomegaly; bowel sounds present GU: Normal external genitalia; physiologic appearing vaginal discharge; no vaginal bleeding; no cervical motion tenderness; right adnexal tenderness Extremities: No deformity; full range of motion; pulses normal Neurologic: Awake, alert and oriented; motor function intact in all extremities and symmetric; no facial droop Skin: Warm and dry Psychiatric: Normal mood and affect   RESULTS  Summary of this visit's results, reviewed by myself:   EKG Interpretation  Date/Time:    Ventricular Rate:    PR Interval:    QRS Duration:   QT Interval:    QTC Calculation:   R Axis:     Text Interpretation:        Laboratory Studies: Results for orders placed or performed during the hospital encounter of 11/15/16 (from the past 24 hour(s))  Pregnancy, urine     Status: None   Collection Time: 11/15/16  1:49  AM  Result Value Ref Range   Preg Test, Ur NEGATIVE NEGATIVE  Urinalysis, Routine w reflex microscopic     Status: None   Collection Time: 11/15/16  1:49 AM  Result Value Ref Range   Color, Urine YELLOW YELLOW   APPearance CLEAR CLEAR   Specific Gravity, Urine 1.021 1.005 - 1.030   pH 6.0 5.0 - 8.0   Glucose, UA NEGATIVE NEGATIVE mg/dL   Hgb urine dipstick NEGATIVE NEGATIVE   Bilirubin Urine NEGATIVE NEGATIVE   Ketones, ur NEGATIVE NEGATIVE mg/dL     Protein, ur NEGATIVE NEGATIVE mg/dL   Nitrite NEGATIVE NEGATIVE   Leukocytes, UA NEGATIVE NEGATIVE  CBC with Differential     Status: Abnormal   Collection Time: 11/15/16  2:04 AM  Result Value Ref Range   WBC 15.4 (H) 4.0 - 10.5 K/uL   RBC 4.59 3.87 - 5.11 MIL/uL   Hemoglobin 13.6 12.0 - 15.0 g/dL   HCT 39.5 36.0 - 46.0 %   MCV 86.1 78.0 - 100.0 fL   MCH 29.6 26.0 - 34.0 pg   MCHC 34.4 30.0 - 36.0 g/dL   RDW 14.0 11.5 - 15.5 %   Platelets 213 150 - 400 K/uL   Neutrophils Relative % 68 %   Neutro Abs 10.5 (H) 1.7 - 7.7 K/uL   Lymphocytes Relative 23 %   Lymphs Abs 3.6 0.7 - 4.0 K/uL   Monocytes Relative 8 %   Monocytes Absolute 1.3 (H) 0.1 - 1.0 K/uL   Eosinophils Relative 1 %   Eosinophils Absolute 0.1 0.0 - 0.7 K/uL   Basophils Relative 0 %   Basophils Absolute 0.0 0.0 - 0.1 K/uL  Comprehensive metabolic panel     Status: None   Collection Time: 11/15/16  2:04 AM  Result Value Ref Range   Sodium 136 135 - 145 mmol/L   Potassium 3.5 3.5 - 5.1 mmol/L   Chloride 103 101 - 111 mmol/L   CO2 24 22 - 32 mmol/L   Glucose, Bld 97 65 - 99 mg/dL   BUN 12 6 - 20 mg/dL   Creatinine, Ser 0.71 0.44 - 1.00 mg/dL   Calcium 9.7 8.9 - 10.3 mg/dL   Total Protein 7.3 6.5 - 8.1 g/dL   Albumin 4.2 3.5 - 5.0 g/dL   AST 17 15 - 41 U/L   ALT 14 14 - 54 U/L   Alkaline Phosphatase 53 38 - 126 U/L   Total Bilirubin 0.6 0.3 - 1.2 mg/dL   GFR calc non Af Amer >60 >60 mL/min   GFR calc Af Amer >60 >60 mL/min   Anion gap 9 5 - 15  Lipase, blood     Status: None   Collection Time: 11/15/16  2:04 AM  Result Value Ref Range   Lipase 27 11 - 51 U/L  Wet prep, genital     Status: Abnormal   Collection Time: 11/15/16  2:56 AM  Result Value Ref Range   Yeast Wet Prep HPF POC NONE SEEN NONE SEEN   Trich, Wet Prep NONE SEEN NONE SEEN   Clue Cells Wet Prep HPF POC NONE SEEN NONE SEEN   WBC, Wet Prep HPF POC MANY (A) NONE SEEN   Sperm NONE SEEN    Imaging Studies: Ct Abdomen Pelvis W  Contrast  Result Date: 11/15/2016 CLINICAL DATA:  Right lower quadrant pain, onset today. EXAM: CT ABDOMEN AND PELVIS WITH CONTRAST TECHNIQUE: Multidetector CT imaging of the abdomen and pelvis was performed using the standard protocol  following bolus administration of intravenous contrast. CONTRAST:  113mL ISOVUE-300 IOPAMIDOL (ISOVUE-300) INJECTION 61% COMPARISON:  None. FINDINGS: Lower chest: Lung bases are clear. Minimal fissural thickening at the right minor fissure. Hepatobiliary: No focal liver abnormality is seen. No gallstones, gallbladder wall thickening, or biliary dilatation. Pancreas: No ductal dilatation or inflammation. Spleen: Normal in size without focal abnormality. Adrenals/Urinary Tract: Adrenal glands are unremarkable. Kidneys are normal, without renal calculi, focal lesion, or hydronephrosis. Bladder is unremarkable. Stomach/Bowel: The appendix is dilated measuring 9 mm, fluid-filled with wall thickening and surrounding periappendiceal fat stranding. No appendicolith. No perforation or abscess. Appendix is pes characterized on coronal reformats. Stomach, small, and large bowel appear unremarkable. No obstruction, wall thickening or inflammation. Vascular/Lymphatic: No significant vascular findings are present. No enlarged abdominal or pelvic lymph nodes. Reproductive: Uterus and bilateral adnexa are unremarkable. Other: Trace free fluid in the pelvis. No free air. No intra- abdominal abscess. Musculoskeletal: There are no acute or suspicious osseous abnormalities. IMPRESSION: Uncomplicated acute appendicitis. Electronically Signed   By: Jeb Levering M.D.   On: 11/15/2016 05:03    ED COURSE  Nursing notes and initial vitals signs, including pulse oximetry, reviewed.  Vitals:   11/15/16 0143 11/15/16 0144 11/15/16 0459  BP: (!) 152/103  133/87  Pulse: (!) 115  (!) 108  Resp: 18  20  Temp: 98.5 F (36.9 C)    TempSrc: Oral    SpO2: 100%  99%  Weight:  150 lb (68 kg)   Height:   5\' 7"  (1.702 m)    5:22 AM Rocephin and Flagyl ordered for acute appendicitis. Dr. Excell Seltzer accepts for transfer to [facility].  PROCEDURES    ED DIAGNOSES     ICD-9-CM ICD-10-CM   1. Acute appendicitis, uncomplicated 981.1 B14.78        Aily Tzeng, Jenny Reichmann, MD 11/15/16 [phone]

## 2016-11-15 NOTE — Anesthesia Procedure Notes (Signed)
Procedure Name: Intubation Date/Time: 11/15/2016 1:14 PM Performed by: Lind Covert Pre-anesthesia Checklist: Patient identified, Emergency Drugs available, Suction available, Patient being monitored and Timeout performed Patient Re-evaluated:Patient Re-evaluated prior to inductionOxygen Delivery Method: Circle system utilized Preoxygenation: Pre-oxygenation with 100% oxygen Intubation Type: IV induction Laryngoscope Size: Mac and 3 Grade View: Grade I Tube type: Oral Tube size: 7.0 mm Airway Equipment and Method: Stylet Placement Confirmation: ETT inserted through vocal cords under direct vision,  positive ETCO2 and breath sounds checked- equal and bilateral Secured at: 22 cm Tube secured with: Tape Dental Injury: Teeth and Oropharynx as per pre-operative assessment

## 2016-11-15 NOTE — ED Triage Notes (Signed)
C/o lower abd pain worse on right and lower back pain onset approx 12 hours ago,  Denies n/v/d denies ua sx

## 2016-11-15 NOTE — Discharge Instructions (Signed)
LAPAROSCOPIC SURGERY: POST OP INSTRUCTIONS  ######################################################################  EAT Gradually transition to a high fiber diet with a fiber supplement over the next few weeks after discharge.  Start with a pureed / full liquid diet (see below)  WALK Walk an hour a day.  Control your pain to do that.    CONTROL PAIN Control pain so that you can walk, sleep, tolerate sneezing/coughing, go up/down stairs.  HAVE A BOWEL MOVEMENT DAILY Keep your bowels regular to avoid problems.  OK to try a laxative to override constipation.  OK to use an antidairrheal to slow down diarrhea.  Call if not better after 2 tries  CALL IF YOU HAVE PROBLEMS/CONCERNS Call if you are still struggling despite following these instructions. Call if you have concerns not answered by these instructions  ######################################################################    1. DIET: Follow a light bland diet the first 24 hours after arrival home, such as soup, liquids, crackers, etc.  Be sure to include lots of fluids daily.  Avoid fast food or heavy meals as your are more likely to get nauseated.  Eat a low fat the next few days after surgery.   2. Take your usually prescribed home medications unless otherwise directed. 3. PAIN CONTROL: a. Pain is best controlled by a usual combination of three different methods TOGETHER: i. Ice/Heat ii. Over the counter pain medication iii. Prescription pain medication b. Most patients will experience some swelling and bruising around the incisions.  Ice packs or heating pads (30-60 minutes up to 6 times a day) will help. Use ice for the first few days to help decrease swelling and bruising, then switch to heat to help relax tight/sore spots and speed recovery.  Some people prefer to use ice alone, heat alone, alternating between ice & heat.  Experiment to what works for you.  Swelling and bruising can take several weeks to resolve.   c. It is  helpful to take an over-the-counter pain medication regularly for the first few weeks.  Choose one of the following that works best for you: i. Naproxen (Aleve, etc)  Two 251m tabs twice a day ii. Ibuprofen (Advil, etc) Three 2053mtabs four times a day (every meal & bedtime) iii. Acetaminophen (Tylenol, etc) 500-65044mour times a day (every meal & bedtime) d. A  prescription for pain medication (such as oxycodone, hydrocodone, etc) should be given to you upon discharge.  Take your pain medication as prescribed.  i. If you are having problems/concerns with the prescription medicine (does not control pain, nausea, vomiting, rash, itching, etc), please call us [phone] see if we need to switch you to a different pain medicine that will work better for you and/or control your side effect better. ii. If you need a refill on your pain medication, please contact your pharmacy.  They will contact our office to request authorization. Prescriptions will not be filled after 5 pm or on week-ends. 4. Avoid getting constipated.  Between the surgery and the pain medications, it is common to experience some constipation.  Increasing fluid intake and taking a fiber supplement (such as Metamucil, Citrucel, FiberCon, MiraLax, etc) 1-2 times a day regularly will usually help prevent this problem from occurring.  A mild laxative (prune juice, Milk of Magnesia, MiraLax, etc) should be taken according to package directions if there are no bowel movements after 48 hours.   5. Watch out for diarrhea.  If you have many loose bowel movements, simplify your diet to bland foods & liquids for  a few days.  Stop any stool softeners and decrease your fiber supplement.  Switching to mild anti-diarrheal medications (Kayopectate, Pepto Bismol) can help.  If this worsens or does not improve, please call us. °6. Wash / shower every day.  You may shower over the dressings as they are waterproof.  Continue to shower over incision(s)  after the dressing is off. °7. Remove your waterproof bandages 5 days after surgery.  You may leave the incision open to air.  You may replace a dressing/Band-Aid to cover the incision for comfort if you wish.  °8. ACTIVITIES as tolerated:   °a. You may resume regular (light) daily activities beginning the next day--such as daily self-care, walking, climbing stairs--gradually increasing activities as tolerated.  If you can walk 30 minutes without difficulty, it is safe to try more intense activity such as jogging, treadmill, bicycling, low-impact aerobics, swimming, etc. °b. Save the most intensive and strenuous activity for last such as sit-ups, heavy lifting, contact sports, etc  Refrain from any heavy lifting or straining until you are off narcotics for pain control.   °c. DO NOT PUSH THROUGH PAIN.  Let pain be your guide: If it hurts to do something, don't do it.  Pain is your body warning you to avoid that activity for another week until the pain goes down. °d. You may drive when you are no longer taking prescription pain medication, you can comfortably wear a seatbelt, and you can safely maneuver your car and apply brakes. °e. You may have sexual intercourse when it is comfortable.  °9. FOLLOW UP in our office °a. Please call [facility] at [phone] to set up an appointment to see your surgeon in the office for a follow-up appointment approximately 2-3 weeks after your surgery. °b. Make sure that you call for this appointment the day you arrive home to insure a convenient appointment time. °10. IF YOU HAVE DISABILITY OR FAMILY LEAVE FORMS, BRING THEM TO THE OFFICE FOR PROCESSING.  DO NOT GIVE THEM TO YOUR DOCTOR. ° ° °WHEN TO CALL US [phone]: °1. Poor pain control °2. Reactions / problems with new medications (rash/itching, nausea, etc)  °3. Fever over 101.5 F (38.5 C) °4. Inability to urinate °5. Nausea and/or vomiting °6. Worsening swelling or bruising °7. Continued bleeding from incision. °8. Increased  pain, redness, or drainage from the incision ° ° The clinic staff is available to answer your questions during regular business hours (8:30am-5pm).  Please don’t hesitate to call and ask to speak to one of our nurses for clinical concerns.  ° If you have a medical emergency, go to the nearest emergency room or call 911. ° A surgeon from [facility] is always on call at the hospitals ° ° °[facility] °[address], [address], [place], [place]  [address] ? °MAIN: [phone] ? TOLL FREE: [phone] ?  °FAX [phone] °[email or web address] ° ° ° °Appendicitis °The appendix is a finger-shaped tube that is attached to the large intestine. Appendicitis is inflammation of the appendix. Without treatment, appendicitis can cause the appendix to tear (rupture). A ruptured appendix can lead to a life-threatening infection. It can also lead to the formation of a painful collection of pus (abscess) in the appendix. °What are the causes? °This condition may be caused by a blockage in the appendix that leads to infection. The blockage can be due to: °· A ball of stool. °· Enlarged lymph glands. ° °In some cases, the cause may   not be known. °What increases the risk? °This condition is more likely to develop in people who are 10-30 years of age. °What are the signs or symptoms? °Symptoms of this condition include: °· Pain around the belly button that moves toward the lower right abdomen. The pain can become more severe as time passes. It gets worse with coughing or sudden movements. °· Tenderness in the lower right abdomen. °· Nausea. °· Vomiting. °· Loss of appetite. °· Fever. °· Constipation. °· Diarrhea. °· Generally not feeling well. ° °How is this diagnosed? °This condition may be diagnosed with: °· A physical exam. °· Blood tests. °· Urine test. ° °To confirm the diagnosis, an ultrasound, MRI, or CT scan may be done. °How is this treated? °This condition is usually  treated by taking out the appendix (appendectomy). There are two methods for doing an appendectomy: °· Open appendectomy. In this surgery, the appendix is removed through a large cut (incision) that is made in the lower right abdomen. This procedure may be recommended if: °? You have major scarring from a previous surgery. °? You have a bleeding disorder. °? You are pregnant and are near term. °? You have a condition that makes the laparoscopic procedure impossible, such as an advanced infection or a ruptured appendix. °· Laparoscopic appendectomy. In this surgery, the appendix is removed through small incisions. This procedure usually causes less pain and fewer problems than an open appendectomy. It also has a shorter recovery time. ° °If the appendix has ruptured and an abscess has formed, a drain may be placed into the abscess to remove fluid and antibiotic medicines may be given through an IV tube. The appendix may or may not need to be removed. °This information is not intended to replace advice given to you by your health care provider. Make sure you discuss any questions you have with your health care provider. °Document Released: 06/19/2005 Document Revised: 10/27/2015 Document Reviewed: 11/04/2014 °Elsevier Interactive Patient Education © 2017 [employer] ° °

## 2016-11-15 NOTE — Interval H&P Note (Signed)
History and Physical Interval Note:  11/15/2016 1:09 PM  Erin Byrd  has presented today for surgery, with the diagnosis of appendicitis.  The various methods of treatment have been discussed with the patient and family. After consideration of risks, benefits and other options for treatment, the patient has consented to    Procedure(s): APPENDECTOMY LAPAROSCOPIC (N/A) as a surgical intervention .    The patient's history has been reviewed, patient examined, no change in status, stable for surgery.  I have reviewed the patient's chart and labs.  Questions were answered to the patient's satisfaction.    Earnstine Regal, MD, [facility] Office: [place]

## 2016-11-15 NOTE — H&P (Signed)
Erin Byrd is an 26 y.o. female.   Chief Complaint: abdominal pain HPI: Pt comes to the ED at [facility] with 12 hours of lower abdominal pain going to her back started after 12., No Nausea, vomiting or diarrhea.  Pain was initially described as cramping, needed to have a BM.  It became progressively worse last p.m. Nothing made it better, she had some chills and came to the ED for evaluation. Pain rated 6/10 by pt in the ED.  No UTI or vaginal symptoms reported at the time.    Work up int he ED shows she is afebrile,  BP 152/103, HR 115,sats 100% on R/A. CMP/lipase were normal, WBC up to 15.4, remainder of CBC was normal, pregnancy negative, Wet prep shows WBC's, urine was clear.  CT scan shows early uncomplicated appendicitis.  She is transferred to [facility] for further evaluation and RX.       Past Medical History:  Diagnosis Date  . Chicken pox History of tremors  - on Inderal daily      Past Surgical History:  Procedure Laterality Date  . arm surgery    . WISDOM TOOTH EXTRACTION      Family History  Problem Relation Age of Onset  . Hypertension Mother   . Hypertension Father   . Hypertension Paternal Grandmother   . Hypertension Paternal Grandfather   . Diabetes Paternal Grandfather    Social History:  reports that she has never smoked. She has never used smokeless tobacco. She reports that she drinks about 1.2 - 1.8 oz of alcohol per week . She reports that she does not use drugs.  Allergies:  Allergies  Allergen Reactions  . Erythromycin Nausea And Vomiting  . Penicillins Rash    Prior to Admission medications   Medication Sig Start Date End Date Taking? Authorizing Provider  JUNEL FE 1/20 1-20 MG-MCG tablet  05/19/15   [provider]  propranolol (INDERAL) 20 MG tablet  03/30/15   [provider]     Results for orders placed or performed during the hospital encounter of 11/15/16 (from the past 48 hour(s))  Pregnancy, urine     Status: None   Collection  Time: 11/15/16  1:49 AM  Result Value Ref Range   Preg Test, Ur NEGATIVE NEGATIVE    Comment:        THE SENSITIVITY OF THIS METHODOLOGY IS >20 mIU/mL.   Urinalysis, Routine w reflex microscopic     Status: None   Collection Time: 11/15/16  1:49 AM  Result Value Ref Range   Color, Urine YELLOW YELLOW   APPearance CLEAR CLEAR   Specific Gravity, Urine 1.021 1.005 - 1.030   pH 6.0 5.0 - 8.0   Glucose, UA NEGATIVE NEGATIVE mg/dL   Hgb urine dipstick NEGATIVE NEGATIVE   Bilirubin Urine NEGATIVE NEGATIVE   Ketones, ur NEGATIVE NEGATIVE mg/dL   Protein, ur NEGATIVE NEGATIVE mg/dL   Nitrite NEGATIVE NEGATIVE   Leukocytes, UA NEGATIVE NEGATIVE    Comment: Microscopic not done on urines with negative protein, blood, leukocytes, nitrite, or glucose < 500 mg/dL.  CBC with Differential     Status: Abnormal   Collection Time: 11/15/16  2:04 AM  Result Value Ref Range   WBC 15.4 (H) 4.0 - 10.5 K/uL   RBC 4.59 3.87 - 5.11 MIL/uL   Hemoglobin 13.6 12.0 - 15.0 g/dL   HCT 39.5 36.0 - 46.0 %   MCV 86.1 78.0 - 100.0 fL   MCH 29.6 26.0 - 34.0  pg   MCHC 34.4 30.0 - 36.0 g/dL   RDW 14.0 11.5 - 15.5 %   Platelets 213 150 - 400 K/uL   Neutrophils Relative % 68 %   Neutro Abs 10.5 (H) 1.7 - 7.7 K/uL   Lymphocytes Relative 23 %   Lymphs Abs 3.6 0.7 - 4.0 K/uL   Monocytes Relative 8 %   Monocytes Absolute 1.3 (H) 0.1 - 1.0 K/uL   Eosinophils Relative 1 %   Eosinophils Absolute 0.1 0.0 - 0.7 K/uL   Basophils Relative 0 %   Basophils Absolute 0.0 0.0 - 0.1 K/uL  Comprehensive metabolic panel     Status: None   Collection Time: 11/15/16  2:04 AM  Result Value Ref Range   Sodium 136 135 - 145 mmol/L   Potassium 3.5 3.5 - 5.1 mmol/L   Chloride 103 101 - 111 mmol/L   CO2 24 22 - 32 mmol/L   Glucose, Bld 97 65 - 99 mg/dL   BUN 12 6 - 20 mg/dL   Creatinine, Ser 0.71 0.44 - 1.00 mg/dL   Calcium 9.7 8.9 - 10.3 mg/dL   Total Protein 7.3 6.5 - 8.1 g/dL   Albumin 4.2 3.5 - 5.0 g/dL   AST 17 15 - 41  U/L   ALT 14 14 - 54 U/L   Alkaline Phosphatase 53 38 - 126 U/L   Total Bilirubin 0.6 0.3 - 1.2 mg/dL   GFR calc non Af Amer >60 >60 mL/min   GFR calc Af Amer >60 >60 mL/min    Comment: (NOTE) The eGFR has been calculated using the CKD EPI equation. This calculation has not been validated in all clinical situations. eGFR's persistently <60 mL/min signify possible Chronic Kidney Disease.    Anion gap 9 5 - 15  Lipase, blood     Status: None   Collection Time: 11/15/16  2:04 AM  Result Value Ref Range   Lipase 27 11 - 51 U/L  Wet prep, genital     Status: Abnormal   Collection Time: 11/15/16  2:56 AM  Result Value Ref Range   Yeast Wet Prep HPF POC NONE SEEN NONE SEEN   Trich, Wet Prep NONE SEEN NONE SEEN   Clue Cells Wet Prep HPF POC NONE SEEN NONE SEEN   WBC, Wet Prep HPF POC MANY (A) NONE SEEN   Sperm NONE SEEN    Ct Abdomen Pelvis W Contrast  Result Date: 11/15/2016 CLINICAL DATA:  Right lower quadrant pain, onset today. EXAM: CT ABDOMEN AND PELVIS WITH CONTRAST TECHNIQUE: Multidetector CT imaging of the abdomen and pelvis was performed using the standard protocol following bolus administration of intravenous contrast. CONTRAST:  187m ISOVUE-300 IOPAMIDOL (ISOVUE-300) INJECTION 61% COMPARISON:  None. FINDINGS: Lower chest: Lung bases are clear. Minimal fissural thickening at the right minor fissure. Hepatobiliary: No focal liver abnormality is seen. No gallstones, gallbladder wall thickening, or biliary dilatation. Pancreas: No ductal dilatation or inflammation. Spleen: Normal in size without focal abnormality. Adrenals/Urinary Tract: Adrenal glands are unremarkable. Kidneys are normal, without renal calculi, focal lesion, or hydronephrosis. Bladder is unremarkable. Stomach/Bowel: The appendix is dilated measuring 9 mm, fluid-filled with wall thickening and surrounding periappendiceal fat stranding. No appendicolith. No perforation or abscess. Appendix is pes characterized on coronal  reformats. Stomach, small, and large bowel appear unremarkable. No obstruction, wall thickening or inflammation. Vascular/Lymphatic: No significant vascular findings are present. No enlarged abdominal or pelvic lymph nodes. Reproductive: Uterus and bilateral adnexa are unremarkable. Other: Trace free fluid in the  pelvis. No free air. No intra- abdominal abscess. Musculoskeletal: There are no acute or suspicious osseous abnormalities. IMPRESSION: Uncomplicated acute appendicitis. Electronically Signed   By: Jeb Levering M.D.   On: 11/15/2016 05:03    Review of Systems  Constitutional: Negative.   HENT: Negative.   Eyes: Negative.   Respiratory: Negative.   Cardiovascular: Negative.   Gastrointestinal: Positive for abdominal pain. Negative for blood in stool, constipation, diarrhea, heartburn, melena, nausea and vomiting.  Genitourinary: Negative.   Musculoskeletal: Negative.   Skin: Negative.   Neurological: Negative.   Endo/Heme/Allergies: Negative.   Psychiatric/Behavioral: Negative.     Blood pressure 133/87, pulse (!) 108, temperature 98.5 F (36.9 C), temperature source Oral, resp. rate 20, height '5\' 7"'  (1.702 m), weight 68 kg (150 lb), last menstrual period 11/06/2016, SpO2 99 %. Physical Exam  Constitutional: She is oriented to person, place, and time. She appears well-developed and well-nourished. No distress.  HENT:  Head: Normocephalic.  Eyes: Right eye exhibits no discharge. Left eye exhibits no discharge. No scleral icterus.  Pupils are equal  Neck: Normal range of motion. Neck supple. No JVD present. No tracheal deviation present. No thyromegaly present.  Cardiovascular: Normal rate, regular rhythm, normal heart sounds and intact distal pulses.   No murmur heard. Respiratory: Effort normal and breath sounds normal. No respiratory distress. She has no wheezes. She has no rales. She exhibits no tenderness.  GI: Soft. Bowel sounds are normal. She exhibits no distension and  no mass. There is tenderness (Right lower quadrant). There is no rebound and no guarding.  Musculoskeletal: Normal range of motion. She exhibits no edema or tenderness.  Lymphadenopathy:    She has no cervical adenopathy.  Neurological: She is alert and oriented to person, place, and time. No cranial nerve deficit.  Skin: Skin is warm and dry. No rash noted. She is not diaphoretic. No erythema. No pallor.  Psychiatric: She has a normal mood and affect. Her behavior is normal. Judgment and thought content normal.     Assessment/Plan Acute appendicitis History of tremors - on daily Inderal  Plan: Admit, IV antibiotics, IV hydration, laparoscopic appendectomy later today. Risk and benefits were discussed with patient and her mother. Both are in agreement.  Kamia Insalaco, PA-C 11/15/2016, 6:43 AM

## 2016-11-15 NOTE — Transfer of Care (Signed)
Immediate Anesthesia Transfer of Care Note  Patient: Erin Byrd  Procedure(s) Performed: Procedure(s): APPENDECTOMY LAPAROSCOPIC (N/A)  Patient Location: PACU  Anesthesia Type:General  Level of Consciousness: sedated  Airway & Oxygen Therapy: Patient Spontanous Breathing and Patient connected to face mask oxygen  Post-op Assessment: Report given to RN and Post -op Vital signs reviewed and stable  Post vital signs: Reviewed and stable  Last Vitals:  Vitals:   11/15/16 0702 11/15/16 1223  BP: 133/85   Pulse: (!) 104   Resp: 17 16  Temp: 37.2 C     Last Pain:  Vitals:   11/15/16 1252  TempSrc:   PainSc: 1       Patients Stated Pain Goal: 2 (77/82/42 3536)  Complications: No apparent anesthesia complications

## 2016-11-16 ENCOUNTER — Encounter (HOSPITAL_COMMUNITY): Payer: Self-pay | Admitting: Surgery

## 2016-11-16 DIAGNOSIS — Z880 Allergy status to penicillin: Secondary | ICD-10-CM | POA: Diagnosis not present

## 2016-11-16 DIAGNOSIS — Z79899 Other long term (current) drug therapy: Secondary | ICD-10-CM | POA: Diagnosis not present

## 2016-11-16 DIAGNOSIS — K3580 Unspecified acute appendicitis: Secondary | ICD-10-CM | POA: Diagnosis not present

## 2016-11-16 DIAGNOSIS — R1031 Right lower quadrant pain: Secondary | ICD-10-CM | POA: Diagnosis not present

## 2016-11-16 LAB — GC/CHLAMYDIA PROBE AMP (~~LOC~~) NOT AT ARMC
Chlamydia: NEGATIVE
NEISSERIA GONORRHEA: NEGATIVE

## 2016-11-16 MED ORDER — PROPRANOLOL HCL 20 MG PO TABS
20.0000 mg | ORAL_TABLET | Freq: Every day | ORAL | Status: DC
  Filled 2016-11-16: qty 1

## 2016-11-16 MED ORDER — IBUPROFEN 200 MG PO TABS: ORAL_TABLET | ORAL | Status: AC

## 2016-11-16 MED ORDER — ACETAMINOPHEN 325 MG PO TABS
650.0000 mg | ORAL_TABLET | Freq: Four times a day (QID) | ORAL | Status: AC | PRN
Start: 2016-11-16 — End: ?

## 2016-11-16 MED ORDER — HYDROCODONE-ACETAMINOPHEN 5-325 MG PO TABS: 1.0000 | ORAL_TABLET | ORAL | 0 refills | Status: DC | PRN

## 2016-11-16 MED ORDER — IBUPROFEN 400 MG PO TABS: 600.0000 mg | ORAL_TABLET | Freq: Four times a day (QID) | ORAL | Status: DC | PRN

## 2016-11-16 NOTE — Progress Notes (Signed)
1 Day Post-Op    CC:  Abdominal pain  Subjective: She looks great, tolerating diet and eating, walking on the floor.  Objective: Vital signs in last 24 hours: Temp:  [97.5 F (36.4 C)-98.4 F (36.9 C)] 98 F (36.7 C) (05/17 5625) Pulse Rate:  [69-103] 97 (05/17 0632) Resp:  [14-22] 15 (05/17 6389) BP: (116-137)/(71-85) 134/85 (05/17 0632) SpO2:  [98 %-100 %] 99 % (05/17 3734) Weight:  [68 kg (150 lb)] 68 kg (150 lb) (05/16 1155) Last BM Date: 11/14/16 1080 PO 1700 IV 3150 urine Stool  - 0 Afebrile, VSS No labsd Intake/Output from previous day: 05/16 0701 - 05/17 0700 In: 2780 [P.O.:1080; I.V.:1300; IV Piggyback:200] Out: 3150 [Urine:3150] Intake/Output this shift: No intake/output data recorded.  General appearance: alert, cooperative and no distress GI: soft, sore, sites look fine.  Lab Results:   Recent Labs  11/15/16 0204  WBC 15.4*  HGB 13.6  HCT 39.5  PLT 213    BMET  Recent Labs  11/15/16 0204  NA 136  K 3.5  CL 103  CO2 24  GLUCOSE 97  BUN 12  CREATININE 0.71  CALCIUM 9.7   PT/INR No results for input(s): LABPROT, INR in the last 72 hours.   Recent Labs Lab 11/15/16 0204  AST 17  ALT 14  ALKPHOS 53  BILITOT 0.6  PROT 7.3  ALBUMIN 4.2     Lipase     Component Value Date/Time   LIPASE 27 11/15/2016 0204     Medications: . metoprolol tartrate  2.5 mg Intravenous Q6H   . metronidazole Stopped (11/16/16 2876)   Assessment/Plan Acute appendicitis Hx of tremors FEN:  Regular diet ID:  Pre op DVT:  SCD    Plan:  Home today, follow up in the office.  LOS: 0 days    Erin Byrd 11/16/2016 [phone]

## 2016-11-16 NOTE — Progress Notes (Signed)
RN reviewed discharge instructions with patient and family. All questions answered.   Paperwork, prescriptions, and work note given to patient.   NT rolled patient down with all belongings to family car.

## 2016-11-22 NOTE — Discharge Summary (Signed)
Physician Discharge Summary  Patient ID: Erin Byrd MRN: [id number] DOB/AGE: 08/29/1990 25 y.o.  Admit date: 11/15/2016 Discharge date: 11/16/2016  Admission Diagnoses:  Acute appendicitis Hx of tremors  Discharge Diagnoses:  Same  Principal Problem:   Acute appendicitis s/p lap appendectomy 11/15/2016   PROCEDURES: laparoscopic appendectomy 11/15/16, Dr. Hyman Bible Course:   Pt comes to the ED at [facility] with 12 hours of lower abdominal pain going to her back started after 12., No Nausea, vomiting or diarrhea.  Pain was initially described as cramping, needed to have a BM.  It became progressively worse last p.m. Nothing made it better, she had some chills and came to the ED for evaluation. Pain rated 6/10 by pt in the ED.  No UTI or vaginal symptoms reported at the time.    Work up int he ED shows she is afebrile,  BP 152/103, HR 115,sats 100% on R/A. CMP/lipase were normal, WBC up to 15.4, remainder of CBC was normal, pregnancy negative, Wet prep shows WBC's, urine was clear.  CT scan shows early uncomplicated appendicitis.  She is transferred to [facility] for further evaluation and RX.   She was seen in the ED and taken to the OR later that day.  She did well post op, she was eating , tolerating diet and ready for discharge later the following AM.   Condition on d/c:  Improved    CBC Latest Ref Rng & Units 11/15/2016 09/21/2016  WBC 4.0 - 10.5 K/uL 15.4(H) 7.8  Hemoglobin 12.0 - 15.0 g/dL 13.6 14.0  Hematocrit 36.0 - 46.0 % 39.5 41.7  Platelets 150 - 400 K/uL 213 257.0    CMP Latest Ref Rng & Units 11/15/2016 09/21/2016  Glucose 65 - 99 mg/dL 97 85  BUN 6 - 20 mg/dL 12 12  Creatinine 0.44 - 1.00 mg/dL 0.71 0.77  Sodium 135 - 145 mmol/L 136 139  Potassium 3.5 - 5.1 mmol/L 3.5 4.4  Chloride 101 - 111 mmol/L 103 105  CO2 22 - 32 mmol/L 24 27  Calcium 8.9 - 10.3 mg/dL 9.7 9.8  Total Protein 6.5 - 8.1 g/dL 7.3 7.6  Total Bilirubin 0.3 - 1.2 mg/dL 0.6 0.4  Alkaline Phos 38  - 126 U/L 53 52  AST 15 - 41 U/L 17 13  ALT 14 - 54 U/L 14 12   Disposition: 01-Home or Self Care   Allergies as of 11/16/2016      Reactions   Erythromycin Nausea And Vomiting, Other (See Comments)   Reaction:  Abdominal pain   Penicillins Rash, Other (See Comments)   Has patient had a PCN reaction causing immediate rash, facial/tongue/throat swelling, SOB or lightheadedness with hypotension: No Has patient had a PCN reaction causing severe rash involving mucus membranes or skin necrosis: No Has patient had a PCN reaction that required hospitalization No Has patient had a PCN reaction occurring within the last 10 years: No If all of the above answers are "NO", then may proceed with Cephalosporin use.      Medication List    STOP taking these medications   clonazePAM 0.5 MG tablet Commonly known as:  KLONOPIN   escitalopram 10 MG tablet Commonly known as:  LEXAPRO     TAKE these medications   acetaminophen 325 MG tablet Commonly known as:  TYLENOL Take 2 tablets (650 mg total) by mouth every 6 (six) hours as needed for mild pain (or temp > 100).   HYDROcodone-acetaminophen 5-325 MG tablet Commonly known as:  NORCO/VICODIN Take 1-2 tablets by mouth every 4 (four) hours as needed for moderate pain.   ibuprofen 200 MG tablet Commonly known as:  ADVIL,MOTRIN You can take 2-3 tablets every 6 hours as needed.  You can alternate with Tylenol as needed or you can use prescribed pain medicine for an alternate pain medicine.   norethindrone-ethinyl estradiol 1-20 MG-MCG tablet Commonly known as:  JUNEL FE,GILDESS FE,LOESTRIN FE Take 1 tablet by mouth daily. Notes to patient:  Home regimen   propranolol 20 MG tablet Commonly known as:  INDERAL Take 20 mg by mouth daily.   triamcinolone cream 0.1 % Commonly known as:  KENALOG Apply 1 application topically 2 (two) times daily as needed (for rash). What changed:  Another medication with the same name was removed. Continue  taking this medication, and follow the directions you see here.      Follow-up [facility], [place]. Schedule an appointment as soon as possible for a visit in 3 week(s).   Specialty:  General Surgery Why:  To follow up after your operation, To follow up after your hospital stay Contact information: [address] [place]          Signed: Earnstine Regal 11/22/2016, 2:37 PM

## 2016-11-23 NOTE — Progress Notes (Signed)
Erin Byrd is a 26 y.o. female here to Establish care, transferring from [place] office. Status-post Appendectomy, check incisions.  I acted as a Education administrator for [employer], PA-C Anselmo Pickler, LPN  History of Present Illness:   Chief Complaint  Patient presents with  . Establish Care    transfer from [place] office  . S/P Appendectomy    surgery 5/16, 1 week and 2 days post op  . Wound Check    Acute Concerns: Status post appendectomy -- underwent appendectomy on 11/15/16 and is currently post-op day 9. She has been doing well since her procedure. She was taking scheduled colace to help with BM right after discharge and is no longer requiring this. Endorses normal appetite, mild incisional pain, no fever, no drainage from incisions, no acute concerns, has schedlued follow up on June 12 with surgical team. Pathology report shows no malignancy of appendix.  Chronic Issues: Anxiety -- well controlled, has not started Lexapro, has started breathing exercises and physical exercises, is taking Klonopin prn at night to help with sleep and anxiety Tremor -- feels as though her tremor is slightly worse since surgery, takes 20 mg propranolol daily. Is due for follow up with Dr. Sabra Heck, her neurologist.  Health Maintenance: Immunizations -- up to date PAP -- last year, normal Weight -- Weight: 160 lb (72.6 kg)  Mood -- good, mild anxiety at times  Depression screen [facility] 2/9 11/24/2016  Decreased Interest 0  Down, Depressed, Hopeless 0  PHQ - 2 Score 0    Other providers/specialists: [place] Charon -- ob-gyn Dr. Sabra Heck - neurology (tremor) Eye doctor -- regular with visits Dentist -- regular with visits  PMHx, SurgHx, SocialHx, Medications, and Allergies were reviewed in the Visit Navigator and updated as appropriate.  Current Medications:   Current Outpatient Prescriptions:  .  acetaminophen (TYLENOL) 325 MG tablet, Take 2 tablets (650 mg total) by mouth every 6 (six) hours as needed for  mild pain (or temp > 100)., Disp: , Rfl:  .  clonazePAM (KLONOPIN) 0.5 MG tablet, Take 0.5 mg by mouth as needed for anxiety., Disp: , Rfl:  .  ibuprofen (ADVIL,MOTRIN) 200 MG tablet, You can take 2-3 tablets every 6 hours as needed.  You can alternate with Tylenol as needed or you can use prescribed pain medicine for an alternate pain medicine., Disp: , Rfl:  .  norethindrone-ethinyl estradiol (JUNEL FE,GILDESS FE,LOESTRIN FE) 1-20 MG-MCG tablet, Take 1 tablet by mouth daily., Disp: , Rfl:  .  propranolol (INDERAL) 20 MG tablet, Take 20 mg by mouth daily. , Disp: , Rfl: 1 .  escitalopram (LEXAPRO) 10 MG tablet, Take 10 mg by mouth daily., Disp: , Rfl: 0 .  triamcinolone cream (KENALOG) 0.1 %, Apply 1 application topically 2 (two) times daily as needed (for rash)., Disp: , Rfl:    Review of Systems:   Review of Systems  Constitutional: Negative for chills, fever, malaise/fatigue and weight loss.  HENT: Negative for congestion and sore throat.   Eyes: Negative for blurred vision.  Respiratory: Negative for cough and shortness of breath.   Cardiovascular: Negative for chest pain, palpitations and leg swelling.  Gastrointestinal: Negative for abdominal pain, constipation, diarrhea, heartburn, nausea and vomiting.  Genitourinary: Negative for dysuria, frequency and urgency.  Musculoskeletal: Negative for back pain, myalgias and neck pain.       Abdominal tenderness from surgery  Skin: Negative for itching and rash.  Neurological: Positive for tremors. Negative for dizziness, tingling, seizures, loss of consciousness and headaches.  Endo/Heme/Allergies: Negative for polydipsia.  Psychiatric/Behavioral: Negative for depression. The patient is not nervous/anxious.     Vitals:   Vitals:   11/24/16 1615  BP: 122/80  Pulse: 72  Temp: 99 F (37.2 C)  TempSrc: Oral  SpO2: 99%  Weight: 160 lb (72.6 kg)  Height: 5\' 7"  (1.702 m)     Body mass index is 25.06 kg/m.  Physical Exam:    Physical Exam  Constitutional: She appears well-developed. She is cooperative.  Non-toxic appearance. She does not have a sickly appearance. She does not appear ill. No distress.  Cardiovascular: Normal rate, regular rhythm, S1 normal, S2 normal, normal heart sounds and normal pulses.   No LE edema  Pulmonary/Chest: Effort normal and breath sounds normal.  Abdominal: Bowel sounds are normal. There is no tenderness.  3 x 0.5-inch incisions on abdomen located at umbilicus, LLQ and RUQ, all incisions healing well without evidence of purulent discharge, odor or significant erythema -- overall appears to be healing well  Neurological: She is alert.  Nursing note and vitals reviewed.    Assessment and Plan:    Carleena was seen today for establish care, s/p appendectomy and wound check.  Diagnoses and all orders for this visit:  Encounter to establish care She is due for annual physical exam and will schedule at her convenience.  S/P appendectomy Post-surgical wounds appear to be healing well. Follow-up with surgeon as scheduled.  Essential tremor Uncontrolled. I advised her to follow up with Dr. Sabra Heck, her neurologist, to discuss her medication management.  Generalized anxiety disorder Currently well controlled. No intervention indicated at this time.   . Reviewed expectations re: course of current medical issues. . Discussed self-management of symptoms. . Outlined signs and symptoms indicating need for more acute intervention. . Patient verbalized understanding and all questions were answered. . See orders for this visit as documented in the electronic medical record. . Patient received an After-Visit Summary.  CMA or LPN served as scribe during this visit. History, Physical, and Plan performed by medical provider. Documentation and orders reviewed and attested to.  Inda Coke, PA-C

## 2016-11-24 ENCOUNTER — Ambulatory Visit (INDEPENDENT_AMBULATORY_CARE_PROVIDER_SITE_OTHER): Payer: 59 | Admitting: Physician Assistant

## 2016-11-24 ENCOUNTER — Encounter: Payer: Self-pay | Admitting: Physician Assistant

## 2016-11-24 VITALS — BP 122/80 | HR 72 | Temp 99.0°F | Ht 67.0 in | Wt 160.0 lb

## 2016-11-24 DIAGNOSIS — F411 Generalized anxiety disorder: Secondary | ICD-10-CM

## 2016-11-24 DIAGNOSIS — Z7689 Persons encountering health services in other specified circumstances: Secondary | ICD-10-CM

## 2016-11-24 DIAGNOSIS — Z9049 Acquired absence of other specified parts of digestive tract: Secondary | ICD-10-CM

## 2016-11-24 DIAGNOSIS — G250 Essential tremor: Secondary | ICD-10-CM

## 2016-12-01 NOTE — Addendum Note (Signed)
Addendum  created 12/01/16 1500 by Rica Koyanagi, MD   Sign clinical note

## 2016-12-05 ENCOUNTER — Other Ambulatory Visit: Payer: Self-pay | Admitting: Physician Assistant

## 2016-12-05 MED ORDER — DOXYCYCLINE HYCLATE 100 MG PO TABS
100.0000 mg | ORAL_TABLET | Freq: Two times a day (BID) | ORAL | 0 refills | Status: DC
Start: 2016-12-05 — End: 2017-02-12

## 2017-01-15 ENCOUNTER — Other Ambulatory Visit: Payer: Self-pay

## 2017-01-15 ENCOUNTER — Encounter: Payer: Self-pay | Admitting: Physician Assistant

## 2017-01-15 ENCOUNTER — Encounter: Payer: Self-pay | Admitting: Neurology

## 2017-01-15 DIAGNOSIS — G250 Essential tremor: Secondary | ICD-10-CM

## 2017-01-18 ENCOUNTER — Encounter: Payer: Self-pay | Admitting: Neurology

## 2017-02-01 MED FILL — LARIN FE 1-20 TABLET: 1-20 | 28 days supply | Qty: 28 | Fill #0

## 2017-02-01 MED FILL — PROPRANOLOL 20 MG TABLET: 20 | 30 days supply | Qty: 90 | Fill #0

## 2017-02-12 ENCOUNTER — Encounter: Payer: Self-pay | Admitting: Family Medicine

## 2017-02-12 ENCOUNTER — Ambulatory Visit (INDEPENDENT_AMBULATORY_CARE_PROVIDER_SITE_OTHER): Payer: 59 | Admitting: Family Medicine

## 2017-02-12 VITALS — BP 128/86 | HR 86 | Temp 98.6°F | Wt 162.0 lb

## 2017-02-12 DIAGNOSIS — R1013 Epigastric pain: Secondary | ICD-10-CM

## 2017-02-12 NOTE — Progress Notes (Signed)
   Subjective:    Patient ID: Erin Byrd, female    DOB: 29-Apr-1991, 26 y.o.   MRN: [id number]  HPI This is a 26 yo female who presents today with 3 days of abdominal pain. Ate a very spicy dinner 4 days ago ([place] food) and had 2 glasses of wine, woke up the next day feeling a little crampy, ate normally. Upper and lower abdominal cramping initially that is now more upper abdominal. Alsi some intermittent back discomfort. Yesterday, woke up and ate a bland breakfast, over last day has had fewer cramps. Took a ranitidine this morning and had significant improvement in her symptoms. No fever/chills, no nausea or vomiting, no diarrhea or constipation, no blood in stool. No dysuria, no hematuria.   No past medical history on file. Past Surgical History:  Procedure Laterality Date  . APPENDECTOMY    . arm surgery    . LAPAROSCOPIC APPENDECTOMY N/A 11/15/2016   Procedure: APPENDECTOMY LAPAROSCOPIC;  Surgeon: Armandina Gemma, MD;  Location: [facility];  Service: General;  Laterality: N/A;  . WISDOM TOOTH EXTRACTION     Family History  Problem Relation Age of Onset  . Hypertension Mother   . Hypertension Father   . Hypertension Paternal Grandmother   . Hypertension Paternal Grandfather   . Diabetes Paternal Grandfather   . Breast cancer Neg Hx   . Colon cancer Neg Hx    Social History  Substance Use Topics  . Smoking status: Never Smoker  . Smokeless tobacco: Never Used  . Alcohol use 1.2 - 1.8 oz/week    2 - 3 Cans of beer per week      Review of Systems Per HPI    Objective:   Physical Exam  Constitutional: She is oriented to person, place, and time. She appears well-developed and well-nourished. No distress.  HENT:  Head: Normocephalic and atraumatic.  Eyes: Conjunctivae are normal.  Cardiovascular: Normal rate, regular rhythm and normal heart sounds.   Pulmonary/Chest: Effort normal and breath sounds normal.  Abdominal: Soft. Bowel sounds are normal. There is no tenderness.  There is no rigidity, no rebound, no guarding and no CVA tenderness.  Neurological: She is alert and oriented to person, place, and time.  Skin: Skin is warm and dry. She is not diaphoretic.  Psychiatric: She has a normal mood and affect. Her behavior is normal. Judgment and thought content normal.  Vitals reviewed.     BP 128/86 (BP Location: Right Arm, Patient Position: Sitting, Cuff Size: Normal)   Pulse 86   Temp 98.6 F (37 C) (Oral)   Wt 162 lb (73.5 kg)   LMP 02/05/2017   SpO2 97%   BMI 25.37 kg/m  Wt Readings from Last 3 Encounters:  02/12/17 162 lb (73.5 kg)  11/24/16 160 lb (72.6 kg)  11/15/16 150 lb (68 kg)       Assessment & Plan:  1. Epigastric pain - improving over last 24 hours and with ranitidine - instructed her to take otc ranitidine or famotidine twice a day for 2 weeks - RTC/ED instructions reviewed   Clarene Reamer, FNP-BC  [facility] [place], [place]  02/15/2017 8:07 PM

## 2017-02-12 NOTE — Patient Instructions (Signed)
Take either over the counter zantac or pepcid twice a day for 2 weeks Please let me know if your symptoms don't resolve or get worse  Gastritis, Adult Gastritis is inflammation of the stomach. There are two kinds of gastritis:  Acute gastritis. This kind develops suddenly.  Chronic gastritis. This kind lasts for a long time.  Gastritis happens when the lining of the stomach becomes weak or gets damaged. Without treatment, gastritis can lead to stomach bleeding and ulcers. What are the causes? This condition may be caused by:  An infection.  Drinking too much alcohol.  Certain medicines.  Having too much acid in the stomach.  A disease of the intestines or stomach.  Stress.  What are the signs or symptoms? Symptoms of this condition include:  Pain or a burning in the upper abdomen.  Nausea.  Vomiting.  An uncomfortable feeling of fullness after eating.  In some cases, there are no symptoms. How is this diagnosed? This condition may be diagnosed with:  A description of your symptoms.  A physical exam.  Tests. These can include: ? Blood tests. ? Stool tests. ? A test in which a thin, flexible instrument with a light and camera on the end is passed down the esophagus and into the stomach (upper endoscopy). ? A test in which a sample of tissue is taken for testing (biopsy).  How is this treated? This condition may be treated with medicines. If the condition is caused by a bacterial infection, you may be given antibiotic medicines. If it is caused by too much acid in the stomach, you may get medicines called H2 blockers, proton pump inhibitors, or antacids. Treatment may also involve stopping the use of certain medicines, such as aspirin, ibuprofen, or other nonsteroidal anti-inflammatory drugs (NSAIDs). Follow these instructions at home:  Take over-the-counter and prescription medicines only as told by your health care provider.  If you were prescribed an  antibiotic, take it as told by your health care provider. Do not stop taking the antibiotic even if you start to feel better.  Drink enough fluid to keep your urine clear or pale yellow.  Eat small, frequent meals instead of large meals. Contact a health care provider if:  Your symptoms get worse.  Your symptoms return after treatment. Get help right away if:  You vomit blood or material that looks like coffee grounds.  You have black or dark red stools.  You are unable to keep fluids down.  Your abdominal pain gets worse.  You have a fever.  You do not feel better after 1 week. This information is not intended to replace advice given to you by your health care provider. Make sure you discuss any questions you have with your health care provider. Document Released: 06/13/2001 Document Revised: 02/16/2016 Document Reviewed: 03/13/2015 Elsevier Interactive Patient Education  [employer].

## 2017-02-13 ENCOUNTER — Ambulatory Visit: Payer: 59 | Admitting: Family Medicine

## 2017-02-15 NOTE — Progress Notes (Signed)
Subjective:   Erin Byrd was seen in consultation in the movement disorder clinic at the request of Inda Coke, [place].  The evaluation is for tremor.  The records that were made available to me were reviewed.  [facility]  She last saw him in December, 2016.  He diagnosed her with essential tremor.  Tremor started when she was approximately 26 years old.  She was given propranolol, 20 mg, to use in approximately 2011 on a prn basis, perhaps when she was giving speeches or when she felt more nervous.  Over the course of time, she started to use propranolol 20 mg daily.  She is taking it one to two times per day.  She notes no side effects with the medication.  Symptoms involve the bilateral upper extremities.  Tremor is most noticeable when speaking publicly, under stressed, if she fatigued.   There is a family hx of tremor when she was younger but that got better.  Her father seems to tremor a little.  Her paternal GM had some tremor in later years.    Affected by caffeine:  Yes.    Affected by alcohol:  Yes.   Affected by stress:  Yes.   Affected by fatigue:  Yes.   Spills soup if on spoon:  No. Spills glass of liquid if full:  No. Affects ADL's (tying shoes, brushing teeth, etc):  No.  Current/Previously tried tremor medications: propranolol  Current medications that may exacerbate tremor:  n/a  Outside reports reviewed: historical medical records, office notes and referral letter/letters.  Allergies  Allergen Reactions  . Erythromycin Nausea And Vomiting and Other (See Comments)    Reaction:  Abdominal pain  . Penicillins Rash and Other (See Comments)    Has patient had a PCN reaction causing immediate rash, facial/tongue/throat swelling, SOB or lightheadedness with hypotension: No Has patient had a PCN reaction causing severe rash involving mucus membranes or skin necrosis: No Has patient had a PCN reaction that required  hospitalization No Has patient had a PCN reaction occurring within the last 10 years: No If all of the above answers are "NO", then may proceed with Cephalosporin use.    Outpatient Encounter Prescriptions as of 02/16/2017  Medication Sig  . acetaminophen (TYLENOL) 325 MG tablet Take 2 tablets (650 mg total) by mouth every 6 (six) hours as needed for mild pain (or temp > 100).  . clonazePAM (KLONOPIN) 0.5 MG tablet Take 0.5 mg by mouth as needed for anxiety.  Marland Kitchen ibuprofen (ADVIL,MOTRIN) 200 MG tablet You can take 2-3 tablets every 6 hours as needed.  You can alternate with Tylenol as needed or you can use prescribed pain medicine for an alternate pain medicine.  . norethindrone-ethinyl estradiol (JUNEL FE,GILDESS FE,LOESTRIN FE) 1-20 MG-MCG tablet Take 1 tablet by mouth daily.  . propranolol (INDERAL) 20 MG tablet Take 20 mg by mouth. 1-2 times a day  . triamcinolone cream (KENALOG) 0.1 % Apply 1 application topically 2 (two) times daily as needed (for rash).  Marland Kitchen escitalopram (LEXAPRO) 10 MG tablet Take 10 mg by mouth daily.   No facility-administered encounter medications on file as of 02/16/2017.     Past Medical History:  Diagnosis Date  . Anxiety   . Tremor     Past Surgical History:  Procedure Laterality Date  . ARM WOUND REPAIR / CLOSURE  2016   broken arm  . LAPAROSCOPIC APPENDECTOMY N/A 11/15/2016   Procedure: APPENDECTOMY  LAPAROSCOPIC;  Surgeon: Armandina Gemma, MD;  Location: [facility];  Service: General;  Laterality: N/A;  . WISDOM TOOTH EXTRACTION      Social History   Social History  . Marital status: Single    Spouse name: N/A  . Number of children: 0  . Years of education: 57   Occupational History  . Clinic Registrar    Social History Main Topics  . Smoking status: Never Smoker  . Smokeless tobacco: Never Used  . Alcohol use 1.2 - 1.8 oz/week    2 - 3 Cans of beer per week  . Drug use: No  . Sexual activity: Yes    Birth control/ protection: Pill   Other  Topics Concern  . Not on file   Social History Narrative   Fun: Relax at home, go out with friends, shop    Family Status  Relation Status  . Mother Alive  . Father Alive  . PGM Deceased  . MGF Deceased  . MGM Deceased  . PGF Alive  . Neg Hx (Not Specified)    Review of Systems A complete 10 system ROS was obtained and was negative apart from what is mentioned.   Objective:   VITALS:   Vitals:   02/16/17 0931  BP: 130/86  Pulse: (!) 110  SpO2: 98%  Weight: 164 lb (74.4 kg)  Height: 5\' 7"  (1.702 m)   Gen:  Appears stated age and in NAD. HEENT:  Normocephalic, atraumatic. The mucous membranes are moist. The superficial temporal arteries are without ropiness or tenderness. Cardiovascular: tachycardic.  Regular rhythm. Lungs: Clear to auscultation bilaterally. Neck: There are no carotid bruits noted bilaterally.  NEUROLOGICAL:  Orientation:  The patient is alert and oriented x 3.  Recent and remote memory are intact.  Attention span and concentration are normal.  Able to name objects and repeat without trouble.  Fund of knowledge is appropriate Cranial nerves: There is good facial symmetry. The pupils are equal round and reactive to light bilaterally. Fundoscopic exam reveals clear disc margins bilaterally. Extraocular muscles are intact and visual fields are full to confrontational testing. Speech is fluent and clear. Soft palate rises symmetrically and there is no tongue deviation. Hearing is intact to conversational tone. Tone: Tone is good throughout. Sensation: Sensation is intact to light touch throughout (facial, trunk, extremities). Vibration is intact at the bilateral big toe. There is no extinction with double simultaneous stimulation. There is no sensory dermatomal level identified. Coordination:  The patient has no dysdiadichokinesia or dysmetria. Motor: Strength is 5/5 in the bilateral upper and lower extremities.  Shoulder shrug is equal bilaterally.  There is  no pronator drift.  There are no fasciculations noted. DTR's: Deep tendon reflexes are 2/4 at the bilateral biceps, triceps, brachioradialis, patella and achilles.  Plantar responses are downgoing bilaterally. Gait and Station: The patient is able to ambulate without difficulty. The patient is able to heel toe walk without any difficulty. The patient is able to ambulate in a tandem fashion. The patient is able to stand in the Romberg position.   MOVEMENT EXAM: Tremor:  There is  tremor in the UE, noted most significantly with action; the L is worse than the right (she is L handed).  Tremor is evidence with Archimedes spirals.  There is no tremor at rest.  The patient is not able to pour water from one glass to another without spilling it.  Labs    Chemistry      Component Value Date/Time  NA 136 11/15/2016 0204   K 3.5 11/15/2016 0204   CL 103 11/15/2016 0204   CO2 24 11/15/2016 0204   BUN 12 11/15/2016 0204   CREATININE 0.71 11/15/2016 0204      Component Value Date/Time   CALCIUM 9.7 11/15/2016 0204   ALKPHOS 53 11/15/2016 0204   AST 17 11/15/2016 0204   ALT 14 11/15/2016 0204   BILITOT 0.6 11/15/2016 0204     Lab Results  Component Value Date   TSH 0.95 09/21/2016        Assessment/Plan:   1.  Essential Tremor, overall moderate in the L hand  -This is evidenced by the symmetrical nature and longstanding hx of gradually getting worse.  We discussed nature and pathophysiology.  We discussed that this can continue to gradually get worse with time.  We discussed that some medications can worsen this, as can caffeine use.  We discussed medication therapy as well as surgical therapy.  She has been on propranolol since about 2011, originally starting out on a prn basis and now taking 20 mg daily to bid.  It is helping but not enough and tremor has become moderate in the L hand.  She is on birth control.  She is getting married in march but no plans/desires to have children for  several years.  We will increase propranolol to 40 mg in the AM and continue 20 mg in the PM.  If helpful, may change to inderal LA, 60 mg daily.  Risks, benefits, side effects and alternative therapies were discussed.  The opportunity to ask questions was given and they were answered to the best of my ability.  The patient expressed understanding and willingness to follow the outlined treatment protocols.  Discussed that there is some literature suggesting that primidone and propranolol work synergistically and could try this in future as well.    2.  Follow up is anticipated in the next few months, sooner should new neurologic issues arise.  Much greater than 50% of this visit was spent in counseling and coordinating care.  Total face to face time:  60 min  CC:  Inda Coke, [place]

## 2017-02-16 ENCOUNTER — Ambulatory Visit (INDEPENDENT_AMBULATORY_CARE_PROVIDER_SITE_OTHER): Payer: 59 | Admitting: Neurology

## 2017-02-16 ENCOUNTER — Encounter: Payer: Self-pay | Admitting: Neurology

## 2017-02-16 VITALS — BP 130/86 | HR 110 | Ht 67.0 in | Wt 164.0 lb

## 2017-02-16 DIAGNOSIS — G250 Essential tremor: Secondary | ICD-10-CM

## 2017-02-16 NOTE — Patient Instructions (Signed)
1.  We will increase your propranolol 20 mg - 2 tablets in the AM and continue the 1 tablet at night.  Send me a Pharmacist, community message and let me know how you are doing.  If doing well, we can try to get the long acting version of the medication

## 2017-02-26 DIAGNOSIS — Z01419 Encounter for gynecological examination (general) (routine) without abnormal findings: Secondary | ICD-10-CM | POA: Diagnosis not present

## 2017-02-26 DIAGNOSIS — R234 Changes in skin texture: Secondary | ICD-10-CM | POA: Diagnosis not present

## 2017-02-26 MED FILL — BETAMETHASONE VA 0.1% CREAM: 0.1 | 10 days supply | Qty: 15 | Fill #0

## 2017-02-27 MED FILL — LARIN FE 1-20 TABLET: 1-20 | 84 days supply | Qty: 84 | Fill #0

## 2017-02-28 ENCOUNTER — Encounter: Payer: Self-pay | Admitting: Neurology

## 2017-03-12 MED FILL — PROPRANOLOL 20 MG TABLET: 20 | 30 days supply | Qty: 90 | Fill #1

## 2017-04-19 ENCOUNTER — Encounter: Payer: Self-pay | Admitting: Physician Assistant

## 2017-04-19 ENCOUNTER — Other Ambulatory Visit: Payer: Self-pay | Admitting: Physician Assistant

## 2017-04-19 MED ORDER — FLUTICASONE PROPIONATE 50 MCG/ACT NA SUSP: 2.0000 | Freq: Every day | NASAL | 2 refills | Status: DC

## 2017-04-19 MED FILL — FLUTICASONE PROP 50 MCG SPR: 50 | 30 days supply | Qty: 16 | Fill #0

## 2017-04-23 DIAGNOSIS — N898 Other specified noninflammatory disorders of vagina: Secondary | ICD-10-CM | POA: Diagnosis not present

## 2017-04-23 DIAGNOSIS — R591 Generalized enlarged lymph nodes: Secondary | ICD-10-CM | POA: Diagnosis not present

## 2017-04-24 DIAGNOSIS — N9089 Other specified noninflammatory disorders of vulva and perineum: Secondary | ICD-10-CM | POA: Diagnosis not present

## 2017-04-24 DIAGNOSIS — R350 Frequency of micturition: Secondary | ICD-10-CM | POA: Diagnosis not present

## 2017-04-24 DIAGNOSIS — Z113 Encounter for screening for infections with a predominantly sexual mode of transmission: Secondary | ICD-10-CM | POA: Diagnosis not present

## 2017-04-24 DIAGNOSIS — R591 Generalized enlarged lymph nodes: Secondary | ICD-10-CM | POA: Diagnosis not present

## 2017-04-26 MED FILL — valACYclovir HCL 1 GM TABS: 1 | 10 days supply | Qty: 20 | Fill #0

## 2017-05-16 ENCOUNTER — Other Ambulatory Visit: Payer: Self-pay | Admitting: Physician Assistant

## 2017-05-16 ENCOUNTER — Encounter: Payer: Self-pay | Admitting: Physician Assistant

## 2017-05-16 MED ORDER — HYDROCORTISONE 2.5 % RE CREA: 1.0000 "application " | TOPICAL_CREAM | Freq: Two times a day (BID) | RECTAL | 0 refills | Status: DC

## 2017-05-16 MED FILL — PROCTOZONE-HC 2.5 % CREA: 2.5 | 15 days supply | Qty: 30 | Fill #0

## 2017-05-21 MED FILL — PROPRANOLOL 20 MG TABLET: 20 | 30 days supply | Qty: 90 | Fill #2

## 2017-05-23 MED FILL — KETOCONAZOLE 2% CREAM: 2 | 20 days supply | Qty: 30 | Fill #0

## 2017-05-29 DIAGNOSIS — Z7189 Other specified counseling: Secondary | ICD-10-CM | POA: Diagnosis not present

## 2017-05-29 DIAGNOSIS — Z712 Person consulting for explanation of examination or test findings: Secondary | ICD-10-CM | POA: Diagnosis not present

## 2017-05-31 NOTE — Progress Notes (Signed)
Subjective:   Erin Byrd was seen in consultation in the movement disorder clinic at the request of Inda Coke, [place].  The evaluation is for tremor.  The records that were made available to me were reviewed.  [facility]  She last saw him in December, 2016.  He diagnosed her with essential tremor.  Tremor started when she was approximately 26 years old.  She was given propranolol, 20 mg, to use in approximately 2011 on a prn basis, perhaps when she was giving speeches or when she felt more nervous.  Over the course of time, she started to use propranolol 20 mg daily.  She is taking it one to two times per day.  She notes no side effects with the medication.  Symptoms involve the bilateral upper extremities.  Tremor is most noticeable when speaking publicly, under stressed, if she fatigued.   There is a family hx of tremor when she was younger but that got better.  Her father seems to tremor a little.  Her paternal GM had some tremor in later years.    Affected by caffeine:  Yes.    Affected by alcohol:  Yes.   Affected by stress:  Yes.   Affected by fatigue:  Yes.   Spills soup if on spoon:  No. Spills glass of liquid if full:  No. Affects ADL's (tying shoes, brushing teeth, etc):  No.  Current/Previously tried tremor medications: propranolol  Current medications that may exacerbate tremor:  n/a  Outside reports reviewed: historical medical records, office notes and referral letter/letters.  06/01/17 update: Patient seen today in follow-up.  I increased her propranolol last visit to 40 mg in the morning and 20 mg at night.  She did email me to see if that would cause weight gain, but I told her that would not.  She has lost 11 lbs since our last visit.  States that she is trying as she is getting married in 3 months.  Minimal SE with medication - perhaps little fatigue with 3rd dosage.  Overall, she is happy with this increase in  medication dose.  She really thinks it has helped.  Allergies  Allergen Reactions  . Erythromycin Nausea And Vomiting and Other (See Comments)    Reaction:  Abdominal pain  . Penicillins Rash and Other (See Comments)    Has patient had a PCN reaction causing immediate rash, facial/tongue/throat swelling, SOB or lightheadedness with hypotension: No Has patient had a PCN reaction causing severe rash involving mucus membranes or skin necrosis: No Has patient had a PCN reaction that required hospitalization No Has patient had a PCN reaction occurring within the last 10 years: No If all of the above answers are "NO", then may proceed with Cephalosporin use.    Outpatient Encounter Medications as of 06/01/2017  Medication Sig  . acetaminophen (TYLENOL) 325 MG tablet Take 2 tablets (650 mg total) by mouth every 6 (six) hours as needed for mild pain (or temp > 100).  . fluticasone (FLONASE) 50 MCG/ACT nasal spray Place 2 sprays into both nostrils daily.  Marland Kitchen ibuprofen (ADVIL,MOTRIN) 200 MG tablet You can take 2-3 tablets every 6 hours as needed.  You can alternate with Tylenol as needed or you can use prescribed pain medicine for an alternate pain medicine.  . norethindrone-ethinyl estradiol (JUNEL FE,GILDESS FE,LOESTRIN FE) 1-20 MG-MCG tablet Take 1 tablet by mouth daily.  . [DISCONTINUED] propranolol (INDERAL) 20 MG tablet 2 in the  morning, 1 in the afternoon  . propranolol ER (INDERAL LA) 60 MG 24 hr capsule Take 1 capsule (60 mg total) by mouth daily.  . [DISCONTINUED] clonazePAM (KLONOPIN) 0.5 MG tablet Take 0.5 mg by mouth as needed for anxiety.  . [DISCONTINUED] escitalopram (LEXAPRO) 10 MG tablet Take 10 mg by mouth daily.  . [DISCONTINUED] hydrocortisone (ANUSOL-HC) 2.5 % rectal cream Place 1 application 2 (two) times daily rectally.  . [DISCONTINUED] triamcinolone cream (KENALOG) 0.1 % Apply 1 application topically 2 (two) times daily as needed (for rash).   No facility-administered  encounter medications on file as of 06/01/2017.     Past Medical History:  Diagnosis Date  . Anxiety   . Tremor     Past Surgical History:  Procedure Laterality Date  . ARM WOUND REPAIR / CLOSURE  2016   broken arm  . LAPAROSCOPIC APPENDECTOMY N/A 11/15/2016   Procedure: APPENDECTOMY LAPAROSCOPIC;  Surgeon: Armandina Gemma, MD;  Location: [facility];  Service: General;  Laterality: N/A;  . WISDOM TOOTH EXTRACTION      Social History   Socioeconomic History  . Marital status: Single    Spouse name: Not on file  . Number of children: 0  . Years of education: 21  . Highest education level: Not on file  Social Needs  . Financial resource strain: Not on file  . Food insecurity - worry: Not on file  . Food insecurity - inability: Not on file  . Transportation needs - medical: Not on file  . Transportation needs - non-medical: Not on file  Occupational History  . Occupation: Quarry manager  Tobacco Use  . Smoking status: Never Smoker  . Smokeless tobacco: Never Used  Substance and Sexual Activity  . Alcohol use: Yes    Alcohol/week: 1.2 - 1.8 oz    Types: 2 - 3 Cans of beer per week  . Drug use: No  . Sexual activity: Yes    Birth control/protection: Pill  Other Topics Concern  . Not on file  Social History Narrative   Fun: Relax at home, go out with friends, shop    Family Status  Relation Name Status  . Mother  Alive  . Father  Alive  . PGM  Deceased  . MGF  Deceased  . MGM  Deceased  . PGF  Alive  . Neg Hx  (Not Specified)    Review of Systems A complete 10 system ROS was obtained and was negative apart from what is mentioned.   Objective:   VITALS:   Vitals:   06/01/17 1526  BP: 110/76  Pulse: 68  SpO2: 98%  Weight: 153 lb (69.4 kg)  Height: 5\' 7"  (1.702 m)   Gen:  Appears stated age and in NAD. HEENT:  Normocephalic, atraumatic. The mucous membranes are moist. The superficial temporal arteries are without ropiness or tenderness. Cardiovascular:  tachycardic.  Regular rhythm. Lungs: Clear to auscultation bilaterally. Neck: There are no carotid bruits noted bilaterally.  NEUROLOGICAL:  Orientation:  The patient is alert and oriented x 3.   Cranial nerves: There is good facial symmetry. . Extraocular muscles are intact and visual fields are full to confrontational testing. Speech is fluent and clear. Soft palate rises symmetrically and there is no tongue deviation. Hearing is intact to conversational tone. Tone: Tone is good throughout. Sensation: Sensation is intact to light touch throughout  Coordination:  The patient has no dysdiadichokinesia or dysmetria. Motor: Strength is 5/5 in the bilateral upper and lower extremities.  Shoulder shrug is equal bilaterally.  There is no pronator drift.  There are no fasciculations noted. Gait and Station: The patient is able to ambulate without difficulty.   MOVEMENT EXAM: Tremor:  There is tremor in the UE, mostly on the L (she is L hand dominant).  Tremor is evident with archimedes spirals but a little better than previous.  Labs    Chemistry      Component Value Date/Time   NA 136 11/15/2016 0204   K 3.5 11/15/2016 0204   CL 103 11/15/2016 0204   CO2 24 11/15/2016 0204   BUN 12 11/15/2016 0204   CREATININE 0.71 11/15/2016 0204      Component Value Date/Time   CALCIUM 9.7 11/15/2016 0204   ALKPHOS 53 11/15/2016 0204   AST 17 11/15/2016 0204   ALT 14 11/15/2016 0204   BILITOT 0.6 11/15/2016 0204     Lab Results  Component Value Date   TSH 0.95 09/21/2016        Assessment/Plan:   1.  Essential Tremor, overall moderate in the L hand  -she feels better than previous.  Will try to change to inderal LA 60 mg from propranolol 20 mg, 2 in the AM and 1 at night for convenience.    2.  Follow up is anticipated in the next 8 months sooner should new neurologic issues arise.    CC:  Inda Coke, [place]

## 2017-06-01 ENCOUNTER — Encounter: Payer: Self-pay | Admitting: Neurology

## 2017-06-01 ENCOUNTER — Ambulatory Visit (INDEPENDENT_AMBULATORY_CARE_PROVIDER_SITE_OTHER): Payer: 59 | Admitting: Neurology

## 2017-06-01 VITALS — BP 110/76 | HR 68 | Ht 67.0 in | Wt 153.0 lb

## 2017-06-01 DIAGNOSIS — G250 Essential tremor: Secondary | ICD-10-CM

## 2017-06-01 MED ORDER — PROPRANOLOL HCL ER 60 MG PO CP24: 60.0000 mg | ORAL_CAPSULE | Freq: Every day | ORAL | 1 refills | Status: DC

## 2017-06-01 MED FILL — PROPRANOLOL ER 60 MG CAP: 60 | 90 days supply | Qty: 90 | Fill #0

## 2017-06-14 DIAGNOSIS — B360 Pityriasis versicolor: Secondary | ICD-10-CM | POA: Diagnosis not present

## 2017-06-18 MED FILL — LARIN FE 1-20 TABLET: 1-20 | 84 days supply | Qty: 84 | Fill #1

## 2017-07-13 DIAGNOSIS — L820 Inflamed seborrheic keratosis: Secondary | ICD-10-CM | POA: Diagnosis not present

## 2017-07-19 ENCOUNTER — Encounter: Payer: Self-pay | Admitting: Gastroenterology

## 2017-07-20 ENCOUNTER — Other Ambulatory Visit: Payer: Self-pay | Admitting: Surgical

## 2017-07-20 MED ORDER — HYDROCORTISONE ACETATE 25 MG RE SUPP: 25.0000 mg | Freq: Two times a day (BID) | RECTAL | 0 refills | Status: DC

## 2017-07-20 MED ORDER — LIDOCAINE 5 % EX OINT: 1.0000 "application " | TOPICAL_OINTMENT | CUTANEOUS | 0 refills | Status: DC | PRN

## 2017-07-20 MED FILL — HEMMOREX-HC 25 MG SUPP: 25 | 6 days supply | Qty: 12 | Fill #0

## 2017-07-25 ENCOUNTER — Ambulatory Visit (INDEPENDENT_AMBULATORY_CARE_PROVIDER_SITE_OTHER): Payer: 59 | Admitting: Gastroenterology

## 2017-07-25 ENCOUNTER — Encounter: Payer: Self-pay | Admitting: Gastroenterology

## 2017-07-25 VITALS — BP 130/86 | HR 84 | Ht 67.0 in | Wt 155.0 lb

## 2017-07-25 DIAGNOSIS — K644 Residual hemorrhoidal skin tags: Secondary | ICD-10-CM | POA: Diagnosis not present

## 2017-07-25 DIAGNOSIS — K6289 Other specified diseases of anus and rectum: Secondary | ICD-10-CM

## 2017-07-25 DIAGNOSIS — K625 Hemorrhage of anus and rectum: Secondary | ICD-10-CM | POA: Diagnosis not present

## 2017-07-25 MED ORDER — HYDROCORTISONE 2.5 % RE CREA: 1.0000 "application " | TOPICAL_CREAM | Freq: Two times a day (BID) | RECTAL | 1 refills | Status: AC

## 2017-07-25 MED FILL — PROCTOZONE-HC 2.5 % CREA: 2.5 | 15 days supply | Qty: 30 | Fill #0

## 2017-07-25 NOTE — Progress Notes (Signed)
Thank you for sending this case to me. I have reviewed the entire note, and the outlined plan seems appropriate.   Henry Danis, MD  

## 2017-07-25 NOTE — Patient Instructions (Signed)
We have sent the following medications to your pharmacy for you to pick up at your convenience:  Hydrocortisone cream twice a day for 10 days as needed. Apply with a gloved finger up to the first knuckle.   We have given you some rectal care instructions.

## 2017-07-25 NOTE — Progress Notes (Signed)
07/25/2017 Erin Byrd [id number] September 14, 1990   HISTORY OF PRESENT ILLNESS:  This is a pleasant 28 year old female who is new to our office.  She presents here today with complaints of rectal bleeding and discomfort that she thinks is due to hemorrhoids.  She tells me that this all began about 6 years ago when she was a sophomore in college.  She says that on and off since then she has had mild discomfort and small amounts of bright red blood upon wiping and having a bowel movement.  The blood is described as bright red and sometimes just a small tinge of pink on the toilet paper.  She says that the pain is not terrible, is more just uncomfortable/burning/itching.  She says that that has returned again recently and she has been using hydrocortisone suppositories that were called in by her PCP.  She also got Recticare and a sitz bath and has been using those.  She says that she feels like it has gotten a little better, but admits that she has only used the suppositories intermittently.  She denies any significant constipation.  She says that typically she has a bowel movement once or twice a day, but recently she has been sometimes skipping a day although when she goes she denies any significant hard stools or straining.  She denies any abdominal pain.   Past Medical History:  Diagnosis Date  . Anxiety   . Tremor    Past Surgical History:  Procedure Laterality Date  . ARM WOUND REPAIR / CLOSURE  2016   broken arm  . LAPAROSCOPIC APPENDECTOMY N/A 11/15/2016   Procedure: APPENDECTOMY LAPAROSCOPIC;  Surgeon: Armandina Gemma, MD;  Location: [facility];  Service: General;  Laterality: N/A;  . WISDOM TOOTH EXTRACTION      reports that  has never smoked. she has never used smokeless tobacco. She reports that she drinks about 1.2 - 1.8 oz of alcohol per week. She reports that she does not use drugs. family history includes Diabetes in her paternal grandfather; Hypertension in her father, mother,  paternal grandfather, and paternal grandmother. Allergies  Allergen Reactions  . Erythromycin Nausea And Vomiting and Other (See Comments)    Reaction:  Abdominal pain  . Penicillins Rash and Other (See Comments)    Has patient had a PCN reaction causing immediate rash, facial/tongue/throat swelling, SOB or lightheadedness with hypotension: No Has patient had a PCN reaction causing severe rash involving mucus membranes or skin necrosis: No Has patient had a PCN reaction that required hospitalization No Has patient had a PCN reaction occurring within the last 10 years: No If all of the above answers are "NO", then may proceed with Cephalosporin use.      Outpatient Encounter Medications as of 07/25/2017  Medication Sig  . acetaminophen (TYLENOL) 325 MG tablet Take 2 tablets (650 mg total) by mouth every 6 (six) hours as needed for mild pain (or temp > 100).  . fluticasone (FLONASE) 50 MCG/ACT nasal spray Place 2 sprays into both nostrils daily.  . hydrocortisone (ANUSOL-HC) 25 MG suppository Place 1 suppository (25 mg total) rectally 2 (two) times daily.  Marland Kitchen ibuprofen (ADVIL,MOTRIN) 200 MG tablet You can take 2-3 tablets every 6 hours as needed.  You can alternate with Tylenol as needed or you can use prescribed pain medicine for an alternate pain medicine.  . norethindrone-ethinyl estradiol (JUNEL FE,GILDESS FE,LOESTRIN FE) 1-20 MG-MCG tablet Take 1 tablet by mouth daily.  . propranolol ER (INDERAL LA)  60 MG 24 hr capsule Take 1 capsule (60 mg total) by mouth daily.  Marland Kitchen lidocaine (XYLOCAINE) 5 % ointment Apply 1 application topically as needed. (Patient not taking: Reported on 07/25/2017)   No facility-administered encounter medications on file as of 07/25/2017.      REVIEW OF SYSTEMS  : All other systems reviewed and negative except where noted in the History of Present Illness.   PHYSICAL EXAM: BP 130/86   Pulse 84   Ht 5\' 7"  (1.702 m)   Wt 155 lb (70.3 kg)   LMP 07/17/2017 (Exact  Date)   BMI 24.28 kg/m  General: Well developed white female in no acute distress Head: Normocephalic and atraumatic Eyes:  Sclerae anicteric, conjunctiva pink. Ears: Normal auditory acuity Lungs: Clear throughout to auscultation; no increased WOB. Heart: Regular rate and rhythm; no M/R/G. Abdomen: Soft, non-distended.  BS present.  Non-tender. Rectal:  Small anterior external hemorrhoid with what appears to be a skin tag in that area as well.  Slightly irritated, but not bleeding.  DRE did not reveal any masses.  Very small amount of light brown stool on the exam glove.  Anoscopy revealed very small internal hemorrhoids. Musculoskeletal: Symmetrical with no gross deformities  Skin: No lesions on visible extremities Extremities: No edema  Neurological: Alert oriented x 4, grossly non-focal Psychological:  Alert and cooperative. Normal mood and affect  ASSESSMENT AND PLAN: *Rectal discomfort, bleeding:  Has a small external hemorrhoid with what appears to be a skin tag in the same area.  Slightly irritated on exam, but not bleeding or painful.  Can use hydrocortisone cream BID for 10 days at a time when needed.  Rectal care instructions reviewed.  Continue sitz baths prn.     CC:  Inda Coke, [place]

## 2017-07-27 ENCOUNTER — Telehealth: Payer: Self-pay | Admitting: Physician Assistant

## 2017-07-27 NOTE — Telephone Encounter (Signed)
Copied from [place] #[id number]. Topic: Quick Communication - See Telephone Encounter >> Jul 27, 2017 12:19 PM Boyd Kerbs wrote: CRM for notification. See Telephone encounter for:   [name] [phone]  ref # [id number]   Regarding Lidocaine  Asking if patient has tried the 3% cream or any other medications first. (If not,  is there a reason why the patient can not try the 3% ?)   07/27/17.

## 2017-07-27 NOTE — Telephone Encounter (Signed)
Please advise as soon as possible.

## 2017-07-30 NOTE — Telephone Encounter (Signed)
Called patient to see if she was still having discomfort. No answer l/m to call office.

## 2017-07-30 NOTE — Telephone Encounter (Signed)
Erin Byrd, please see message.

## 2017-07-31 NOTE — Telephone Encounter (Signed)
Called patient was seen by GI last week. And was given treatment by them. Was told to get over the counter cream and it has helped.

## 2017-08-27 MED FILL — LARIN FE 1-20 TABLET: 1-20 | 84 days supply | Qty: 84 | Fill #2

## 2017-09-05 ENCOUNTER — Telehealth: Payer: Self-pay | Admitting: Physician Assistant

## 2017-09-05 NOTE — Telephone Encounter (Signed)
[place] and spoke to [place] told her to cancel Rx for Lidocaine ointment. Pt does not need it. Marita [place] verbalized understanding.

## 2017-09-05 NOTE — Telephone Encounter (Signed)
See note

## 2017-09-05 NOTE — Telephone Encounter (Signed)
Copied from [place] [id number]. Topic: General - Other >> Sep 05, 2017 11:28 AM Cecelia Byars, NT wrote: Reason for CRM: [employer] called in regards to a prior authorization needed for lidocaine (XYLOCAINE) 5 % ointment ,they have 2 questions  has the patient tried lidocaine  3 % cream and does the patient have a medical reason the lidocaine 3% can't be used  , this is due soon and will be denied if no response received by 09/07/17 please  call   [phone] it can be resubmitted if denied .

## 2017-10-08 DIAGNOSIS — D225 Melanocytic nevi of trunk: Secondary | ICD-10-CM | POA: Diagnosis not present

## 2017-10-11 DIAGNOSIS — H5213 Myopia, bilateral: Secondary | ICD-10-CM | POA: Diagnosis not present

## 2017-10-11 DIAGNOSIS — H52203 Unspecified astigmatism, bilateral: Secondary | ICD-10-CM | POA: Diagnosis not present

## 2017-10-31 ENCOUNTER — Encounter: Payer: Self-pay | Admitting: Physician Assistant

## 2017-10-31 ENCOUNTER — Ambulatory Visit (INDEPENDENT_AMBULATORY_CARE_PROVIDER_SITE_OTHER): Payer: 59 | Admitting: Physician Assistant

## 2017-10-31 VITALS — BP 130/70 | HR 72 | Temp 98.3°F | Ht 67.0 in | Wt 156.5 lb

## 2017-10-31 DIAGNOSIS — G250 Essential tremor: Secondary | ICD-10-CM | POA: Diagnosis not present

## 2017-10-31 DIAGNOSIS — K644 Residual hemorrhoidal skin tags: Secondary | ICD-10-CM

## 2017-10-31 DIAGNOSIS — Z0000 Encounter for general adult medical examination without abnormal findings: Secondary | ICD-10-CM | POA: Diagnosis not present

## 2017-10-31 DIAGNOSIS — F411 Generalized anxiety disorder: Secondary | ICD-10-CM

## 2017-10-31 DIAGNOSIS — Z136 Encounter for screening for cardiovascular disorders: Secondary | ICD-10-CM

## 2017-10-31 DIAGNOSIS — Z13220 Encounter for screening for lipoid disorders: Secondary | ICD-10-CM | POA: Diagnosis not present

## 2017-10-31 MED ORDER — CLONAZEPAM 0.5 MG PO TABS: 0.5000 mg | ORAL_TABLET | Freq: Every day | ORAL | 0 refills | Status: DC

## 2017-10-31 MED FILL — clonazePAM 0.5 MG TABS: 0.5 | 30 days supply | Qty: 30 | Fill #0

## 2017-10-31 NOTE — Assessment & Plan Note (Signed)
Well controlled per patient. Follow-up with GI if symptoms worsen or return.

## 2017-10-31 NOTE — Progress Notes (Signed)
I acted as a Education administrator for [employer], PA-C Anselmo Pickler, LPN  Subjective:    Erin Byrd is a 27 y.o. female and is here for a comprehensive physical exam.  HPI  There are no preventive care reminders to display for this patient.  Acute Concerns: None  Chronic Issues: Essential tremor -- doesn't feel like it as well-controlled with the 60 mg Propranolol HCl extended release tablet as it was when she took 2 x 20 mg Propranolol. This is managed by Dr. Carles Collet in neuro, she is planning to follow-up with her regarding this soon. Anxiety -- overall well controlled. Does use klonopin prn. Has at least 5 pills remaining from my last prescription; uses very sparingly. Hemorrhoid -- saw Alonza Bogus in January for hemorrhoid, has been using lidocaine jelly and hydrocortisone suppositories with good results  Wt Readings from Last 5 Encounters:  10/31/17 156 lb 8 oz (71 kg)  07/25/17 155 lb (70.3 kg)  06/01/17 153 lb (69.4 kg)  02/16/17 164 lb (74.4 kg)  02/12/17 162 lb (73.5 kg)    Health Maintenance: Immunizations -- UTD Colonoscopy -- N/A Mammogram -- N/A PAP -- 02/2015 normal per pt will see GYN in August and have pap results sent to [place]. Bone Density -- N/A Diet -- eats all food groups Caffeine intake -- coffee daily Sleep habits -- no issues Exercise -- 2-3 x a week Weight -- Weight: 156 lb 8 oz (71 kg)   Depression screen PHQ 2/9 10/31/2017  Decreased Interest 0  Down, Depressed, Hopeless 0  PHQ - 2 Score 0   Other providers/specialists: Dr. Carles Collet -- neuro Alonza Bogus - GI  PMHx, SurgHx, SocialHx, Medications, and Allergies were reviewed in the Visit Navigator and updated as appropriate.   History reviewed. No pertinent past medical history.   Past Surgical History:  Procedure Laterality Date  . ARM WOUND REPAIR / CLOSURE  2016   broken arm  . LAPAROSCOPIC APPENDECTOMY N/A 11/15/2016   Procedure: APPENDECTOMY LAPAROSCOPIC;  Surgeon: Armandina Gemma, MD;  Location:  [facility];  Service: General;  Laterality: N/A;  . WISDOM TOOTH EXTRACTION       Family History  Problem Relation Age of Onset  . Hypertension Mother   . Hypertension Father   . Hypertension Paternal Grandmother   . Hypertension Paternal Grandfather   . Diabetes Paternal Grandfather   . Breast cancer Neg Hx   . Colon cancer Neg Hx     Social History   Tobacco Use  . Smoking status: Never Smoker  . Smokeless tobacco: Never Used  Substance Use Topics  . Alcohol use: Yes    Alcohol/week: 1.2 - 1.8 oz    Types: 2 - 3 Cans of beer per week  . Drug use: No    Review of Systems:   Review of Systems  Constitutional: Negative.  Negative for chills, fever, malaise/fatigue and weight loss.  HENT: Negative.  Negative for hearing loss, sinus pain and sore throat.   Eyes: Negative.  Negative for blurred vision.  Respiratory: Negative.  Negative for cough and shortness of breath.   Cardiovascular: Negative.  Negative for chest pain, palpitations and leg swelling.  Gastrointestinal: Negative.  Negative for abdominal pain, constipation, diarrhea, heartburn, nausea and vomiting.  Genitourinary: Negative.  Negative for dysuria, frequency and urgency.  Musculoskeletal: Negative.  Negative for back pain, myalgias and neck pain.  Skin: Negative.  Negative for itching and rash.  Neurological: Negative.  Negative for dizziness, tingling, seizures, loss of consciousness  and headaches.  Endo/Heme/Allergies: Negative.  Negative for polydipsia.  Psychiatric/Behavioral: Negative.  Negative for depression. The patient is not nervous/anxious.     Objective:   BP 130/70 (BP Location: Left Arm, Patient Position: Sitting, Cuff Size: Normal)   Pulse 72   Temp 98.3 F (36.8 C) (Oral)   Ht 5\' 7"  (1.702 m)   Wt 156 lb 8 oz (71 kg)   LMP 10/09/2017   SpO2 99%   BMI 24.51 kg/m   General Appearance:    Alert, cooperative, no distress, appears stated age  Head:    Normocephalic, without obvious  abnormality, atraumatic  Eyes:    PERRL, conjunctiva/corneas clear, EOM's intact, fundi    benign, both eyes  Ears:    Normal TM's and external ear canals, both ears  Nose:   Nares normal, septum midline, mucosa normal, no drainage    or sinus tenderness  Throat:   Lips, mucosa, and tongue normal; teeth and gums normal  Neck:   Supple, symmetrical, trachea midline, no adenopathy;    thyroid:  no enlargement/tenderness/nodules; no carotid   bruit or JVD  Back:     Symmetric, no curvature, ROM normal, no CVA tenderness  Lungs:     Clear to auscultation bilaterally, respirations unlabored  Chest Wall:    No tenderness or deformity   Heart:    Regular rate and rhythm, S1 and S2 normal, no murmur, rub   or gallop  Breast Exam:    Deferred  Abdomen:     Soft, non-tender, bowel sounds active all four quadrants,    no masses, no organomegaly  Genitalia:    Deferred  Rectal:    Deferred  Extremities:   Extremities normal, atraumatic, no cyanosis or edema  Pulses:   2+ and symmetric all extremities  Skin:   Skin color, texture, turgor normal, no rashes or lesions  Lymph nodes:   Cervical, supraclavicular, and axillary nodes normal  Neurologic:   CNII-XII intact, normal strength, sensation and reflexes    Throughout, tremor in UE L>R    Assessment/Plan:   Problem List Items Addressed This Visit      Cardiovascular and Mediastinum   External hemorrhoid    Well controlled per patient. Follow-up with GI if symptoms worsen or return.        Nervous and Auditory   Essential tremor    Management per Dr. Carles Collet.        Other   Generalized anxiety disorder    Klonopin prn. [place] Controlled Substance Database reviewed today regarding patient. Patient is compliant with [place] regarding pharmacy use and one-prescribing provider. Will refill klonopin today x 30 tablets.        Other Visit Diagnoses    Routine physical examination    -  Primary Today patient counseled on age appropriate routine  health concerns for screening and prevention, each reviewed and up to date or declined. Immunizations reviewed and up to date or declined. Labs ordered and reviewed. Risk factors for depression reviewed and negative. Hearing function and visual acuity are intact. ADLs screened and addressed as needed. Functional ability and level of safety reviewed and appropriate. Education, counseling and referrals performed based on assessed risks today. Patient provided with a copy of personalized plan for preventive services.   Encounter for lipid screening for cardiovascular disease          Well Adult Exam: Labs ordered: Yes. Patient counseling was done. See below for items discussed. Discussed the patient's BMI. The BMI  BMI is in the acceptable range Follow up as needed for acute illness.  Patient Counseling:   [x]     Nutrition: Stressed importance of moderation in sodium/caffeine intake, saturated fat and cholesterol, caloric balance, sufficient intake of fresh fruits, vegetables, fiber, calcium, iron, and 1 mg of folate supplement per day (for females capable of pregnancy).   [x]      Stressed the importance of regular exercise.    [x]     Substance Abuse: Discussed cessation/primary prevention of tobacco, alcohol, or other drug use; driving or other dangerous activities under the influence; availability of treatment for abuse.    [x]      Injury prevention: Discussed safety belts, safety helmets, smoke detector, smoking near bedding or upholstery.    [x]      Sexuality: Discussed sexually transmitted diseases, partner selection, use of condoms, avoidance of unintended pregnancy  and contraceptive alternatives.    [x]     Dental health: Discussed importance of regular tooth brushing, flossing, and dental visits.   [x]      Health maintenance and immunizations reviewed. Please refer to Health maintenance section.   CMA or LPN served as scribe during this visit. History, Physical, and Plan performed  by medical provider. Documentation and orders reviewed and attested to.  Inda Coke, PA-C [place]

## 2017-10-31 NOTE — Assessment & Plan Note (Signed)
Klonopin prn. [place] Controlled Substance Database reviewed today regarding patient. Patient is compliant with [place] regarding pharmacy use and one-prescribing provider. Will refill klonopin today x 30 tablets.

## 2017-10-31 NOTE — Assessment & Plan Note (Signed)
Management per Dr. Carles Collet.

## 2017-11-02 ENCOUNTER — Encounter: Payer: 59 | Admitting: Physician Assistant

## 2017-11-07 ENCOUNTER — Other Ambulatory Visit (INDEPENDENT_AMBULATORY_CARE_PROVIDER_SITE_OTHER): Payer: 59

## 2017-11-07 DIAGNOSIS — F411 Generalized anxiety disorder: Secondary | ICD-10-CM

## 2017-11-07 DIAGNOSIS — G250 Essential tremor: Secondary | ICD-10-CM | POA: Diagnosis not present

## 2017-11-07 DIAGNOSIS — Z136 Encounter for screening for cardiovascular disorders: Secondary | ICD-10-CM

## 2017-11-07 DIAGNOSIS — Z13220 Encounter for screening for lipoid disorders: Secondary | ICD-10-CM | POA: Diagnosis not present

## 2017-11-07 LAB — LIPID PANEL
CHOL/HDL RATIO: 2
Cholesterol: 150 mg/dL (ref 0–200)
HDL: 67.5 mg/dL (ref >39.00)
LDL CALC: 69 mg/dL (ref 0–99)
NONHDL: 82.33
Triglycerides: 67 mg/dL (ref 0.0–149.0)
VLDL: 13.4 mg/dL (ref 0.0–40.0)

## 2017-11-07 LAB — CBC
HCT: 39.6 % (ref 36.0–46.0)
Hemoglobin: 13.3 g/dL (ref 12.0–15.0)
MCHC: 33.7 g/dL (ref 30.0–36.0)
MCV: 88 fl (ref 78.0–100.0)
PLATELETS: 217 10*3/uL (ref 150.0–400.0)
RBC: 4.5 Mil/uL (ref 3.87–5.11)
RDW: 13.8 % (ref 11.5–15.5)
WBC: 6 10*3/uL (ref 4.0–10.5)

## 2017-11-07 LAB — COMPREHENSIVE METABOLIC PANEL
ALT: 10 U/L (ref 0–35)
AST: 13 U/L (ref 0–37)
Albumin: 4.1 g/dL (ref 3.5–5.2)
Alkaline Phosphatase: 41 U/L (ref 39–117)
BUN: 11 mg/dL (ref 6–23)
CO2: 27 meq/L (ref 19–32)
CREATININE: 0.77 mg/dL (ref 0.40–1.20)
Calcium: 9.3 mg/dL (ref 8.4–10.5)
Chloride: 105 mEq/L (ref 96–112)
GFR: 95.91 mL/min (ref >60.00)
GLUCOSE: 80 mg/dL (ref 70–99)
Potassium: 4.6 mEq/L (ref 3.5–5.1)
SODIUM: 139 meq/L (ref 135–145)
Total Bilirubin: 0.4 mg/dL (ref 0.2–1.2)
Total Protein: 7.1 g/dL (ref 6.0–8.3)

## 2017-12-04 MED FILL — NORETHIN-ESTRAD-FERR 1-0.02: 1-20 | 84 days supply | Qty: 84 | Fill #3

## 2017-12-13 ENCOUNTER — Encounter: Payer: Self-pay | Admitting: Neurology

## 2017-12-13 MED ORDER — PROPRANOLOL HCL 20 MG PO TABS: ORAL_TABLET | ORAL | 1 refills | Status: DC

## 2017-12-13 MED FILL — PROPRANOLOL 20 MG TABLET: 20 | 90 days supply | Qty: 270 | Fill #0

## 2017-12-15 ENCOUNTER — Ambulatory Visit: Payer: Self-pay | Admitting: Family Medicine

## 2017-12-15 VITALS — BP 125/70 | HR 73 | Temp 97.9°F | Resp 16 | Wt 152.6 lb

## 2017-12-15 DIAGNOSIS — R319 Hematuria, unspecified: Secondary | ICD-10-CM

## 2017-12-15 LAB — POCT URINALYSIS DIPSTICK
Bilirubin, UA: NEGATIVE
Glucose, UA: NEGATIVE
KETONES UA: NEGATIVE
NITRITE UA: NEGATIVE
PROTEIN UA: NEGATIVE
Spec Grav, UA: 1.01 (ref 1.010–1.025)
Urobilinogen, UA: 0.2 E.U./dL
pH, UA: 7 (ref 5.0–8.0)

## 2017-12-15 LAB — POCT URINE PREGNANCY: PREG TEST UR: NEGATIVE

## 2017-12-15 NOTE — Patient Instructions (Addendum)
PLAN< F/U with PCP I n 4-6 week for repeat urine/with micro (avoid vigorous physical activity and not menstruating prior to lab) Slight hematuria- negative HCG and UTI Advised to monitor for flank pain,fever, and edema  Hematuria, Adult Hematuria is blood in your urine. It can be caused by a bladder infection, kidney infection, prostate infection, kidney stone, or cancer of your urinary tract. Infections can usually be treated with medicine, and a kidney stone usually will pass through your urine. If neither of these is the cause of your hematuria, further workup to find out the reason may be needed. It is very important that you tell your health care provider about any blood you see in your urine, even if the blood stops without treatment or happens without causing pain. Blood in your urine that happens and then stops and then happens again can be a symptom of a very serious condition. Also, pain is not a symptom in the initial stages of many urinary cancers. Follow these instructions at home:  Drink lots of fluid, 3-4 quarts a day. If you have been diagnosed with an infection, cranberry juice is especially recommended, in addition to large amounts of water.  Avoid caffeine, tea, and carbonated beverages because they tend to irritate the bladder.  Avoid alcohol because it may irritate the prostate.  Take all medicines as directed by your health care provider.  If you were prescribed an antibiotic medicine, finish it all even if you start to feel better.  If you have been diagnosed with a kidney stone, follow your health care provider's instructions regarding straining your urine to catch the stone.  Empty your bladder often. Avoid holding urine for long periods of time.  After a bowel movement, women should cleanse front to back. Use each tissue only once.  Empty your bladder before and after sexual intercourse if you are a female. Contact a health care provider if:  You develop back  pain.  You have a fever.  You have a feeling of sickness in your stomach (nausea) or vomiting.  Your symptoms are not better in 3 days. Return sooner if you are getting worse. Get help right away if:  You develop severe vomiting and are unable to keep the medicine down.  You develop severe back or abdominal pain despite taking your medicines.  You begin passing a large amount of blood or clots in your urine.  You feel extremely weak or faint, or you pass out. This information is not intended to replace advice given to you by your health care provider. Make sure you discuss any questions you have with your health care provider. Document Released: 06/19/2005 Document Revised: 11/25/2015 Document Reviewed: 02/17/2013 Elsevier Interactive Patient Education  2017 [place] Choices to Help Relieve Diarrhea, Adult When you have diarrhea, the foods you eat and your eating habits are very important. Choosing the right foods and drinks can help:  Relieve diarrhea.  Replace lost fluids and nutrients.  Prevent dehydration.  What general guidelines should I follow? Relieving diarrhea  Choose foods with less than 2 g or .07 oz. of fiber per serving.  Limit fats to less than 8 tsp (38 g or 1.34 oz.) a day.  Avoid the following: ? Foods and beverages sweetened with high-fructose corn syrup, honey, or sugar alcohols such as xylitol, sorbitol, and mannitol. ? Foods that contain a lot of fat or sugar. ? Fried, greasy, or spicy foods. ? High-fiber grains, breads, and cereals. ? Raw fruits and vegetables.  Eat foods that are rich in probiotics. These foods include dairy products such as yogurt and fermented milk products. They help increase healthy bacteria in the stomach and intestines (gastrointestinal tract, or GI tract).  If you have lactose intolerance, avoid dairy products. These may make your diarrhea worse.  Take medicine to help stop diarrhea (antidiarrheal medicine) only  as told by your health care provider. Replacing nutrients  Eat small meals or snacks every 3-4 hours.  Eat bland foods, such as white rice, toast, or baked potato, until your diarrhea starts to get better. Gradually reintroduce nutrient-rich foods as tolerated or as told by your health care provider. This includes: ? Well-cooked protein foods. ? Peeled, seeded, and soft-cooked fruits and vegetables. ? Low-fat dairy products.  Take vitamin and mineral supplements as told by your health care provider. Preventing dehydration   Start by sipping water or a special solution to prevent dehydration (oral rehydration solution, ORS). Urine that is clear or pale yellow means that you are getting enough fluid.  Try to drink at least 8-10 cups of fluid each day to help replace lost fluids.  You may add other liquids in addition to water, such as clear juice or decaffeinated sports drinks, as tolerated or as told by your health care provider.  Avoid drinks with caffeine, such as coffee, tea, or soft drinks.  Avoid alcohol. What foods are recommended? The items listed may not be a complete list. Talk with your health care provider about what dietary choices are best for you. Grains White rice. White, [place], or pita breads (fresh or toasted), including plain rolls, buns, or bagels. White pasta. Saltine, soda, or Shariq Puig crackers. Pretzels. Low-fiber cereal. Cooked cereals made with water (such as cornmeal, farina, or cream cereals). Plain muffins. Matzo. Melba toast. Zwieback. Vegetables Potatoes (without the skin). Most well-cooked and canned vegetables without skins or seeds. Tender lettuce. Fruits Apple sauce. Fruits canned in juice. Cooked apricots, cherries, grapefruit, peaches, pears, or plums. Fresh bananas and cantaloupe. Meats and other protein foods Baked or boiled chicken. Eggs. Tofu. Fish. Seafood. Smooth nut butters. Ground or well-cooked tender beef, ham, veal, lamb, pork, or  poultry. Dairy Plain yogurt, kefir, and unsweetened liquid yogurt. Lactose-free milk, buttermilk, skim milk, or soy milk. Low-fat or nonfat hard cheese. Beverages Water. Low-calorie sports drinks. Fruit juices without pulp. Strained tomato and vegetable juices. Decaffeinated teas. Sugar-free beverages not sweetened with sugar alcohols. Oral rehydration solutions, if approved by your health care provider. Seasoning and other foods Bouillon, broth, or soups made from recommended foods. What foods are not recommended? The items listed may not be a complete list. Talk with your health care provider about what dietary choices are best for you. Grains Whole grain, whole wheat, bran, or rye breads, rolls, pastas, and crackers. Wild or brown rice. Whole grain or bran cereals. Barley. Oats and oatmeal. Corn tortillas or taco shells. Granola. Popcorn. Vegetables Raw vegetables. Fried vegetables. Cabbage, broccoli, Brussels sprouts, artichokes, baked beans, beet greens, corn, kale, legumes, peas, sweet potatoes, and yams. Potato skins. Cooked spinach and cabbage. Fruits Dried fruit, including raisins and dates. Raw fruits. Stewed or dried prunes. Canned fruits with syrup. Meat and other protein foods Fried or fatty meats. Deli meats. Chunky nut butters. Nuts and seeds. Beans and lentils. Berniece Salines. Hot dogs. Sausage. Dairy High-fat cheeses. Whole milk, chocolate milk, and beverages made with milk, such as milk shakes. Half-and-half. Cream. sour cream. Ice cream. Beverages Caffeinated beverages (such as coffee, tea, soda, or energy drinks). Alcoholic  beverages. Fruit juices with pulp. Prune juice. Soft drinks sweetened with high-fructose corn syrup or sugar alcohols. High-calorie sports drinks. Fats and oils Butter. Cream sauces. Margarine. Salad oils. Plain salad dressings. Olives. Avocados. Mayonnaise. Sweets and desserts Sweet rolls, doughnuts, and sweet breads. Sugar-free desserts sweetened with sugar  alcohols such as xylitol and sorbitol. Seasoning and other foods Honey. Hot sauce. Chili powder. Gravy. Cream-based or milk-based soups. Pancakes and waffles. Summary  When you have diarrhea, the foods you eat and your eating habits are very important.  Make sure you get at least 8-10 cups of fluid each day, or enough to keep your urine clear or pale yellow.  Eat bland foods and gradually reintroduce healthy, nutrient-rich foods as tolerated, or as told by your health care provider.  Avoid high-fiber, fried, greasy, or spicy foods. This information is not intended to replace advice given to you by your health care provider. Make sure you discuss any questions you have with your health care provider. Document Released: 09/09/2003 Document Revised: 06/16/2016 Document Reviewed: 06/16/2016 Elsevier Interactive Patient Education  2018 [employer]. Probiotics What are probiotics? Probiotics are the good bacteria and yeasts that live in your body and keep you and your digestive system healthy. Probiotics also help your body's defense (immune) system and protect your body against bad bacterial growth. Certain foods contain probiotics, such as yogurt. Probiotics can also be purchased as a supplement. As with any supplement or drug, it is important to discuss its use with your health care provider. What affects the balance of bacteria in my body? The balance of bacteria in your body can be affected by:  Antibiotic medicines. Antibiotics are sometimes necessary to treat infection. Unfortunately, they may kill good or friendly bacteria in your body as well as the bad bacteria. This may lead to stomach problems like diarrhea, gas, and cramping.  Disease. Some conditions are the result of an overgrowth of bad bacteria, yeasts, parasites, or fungi. These conditions include: ? Infectious diarrhea. ? Stomach and respiratory infections. ? Skin infections. ? Irritable bowel syndrome  (IBS). ? Inflammatory bowel diseases. ? Ulcer due to Helicobacter pylori (H. pylori) infection. ? Tooth decay and periodontal disease. ? Vaginal infections.  Stress and poor diet may also lower the good bacteria in your body. What type of probiotic is right for me? Probiotics are available over the counter at your local pharmacy, health food, or grocery store. They come in many different forms, combinations of strains, and dosing strengths. Some may need to be refrigerated. Always read the label for storage and usage instructions. Specific strains have been shown to be more effective for certain conditions. Ask your health care provider what option is best for you. Why would I need probiotics? There are many reasons your health care provider might recommend a probiotic supplement, including:  Diarrhea.  Constipation.  IBS.  Respiratory infections.  Yeast infections.  Acne, eczema, and other skin conditions.  Frequent urinary tract infections (UTIs).  Are there side effects of probiotics? Some people experience mild side effects when taking probiotics. Side effects are usually temporary and may include:  Gas.  Bloating.  Cramping.  Rarely, serious side effects, such as infection or immune system changes, may occur. What else do I need to know about probiotics?  There are many different strains of probiotics. Certain strains may be more effective depending on your condition. Probiotics are available in varying doses. Ask your health care provider which probiotic you should use and how often.  If  you are taking probiotics along with antibiotics, it is generally recommended to wait at least 2 hours between taking the antibiotic and taking the probiotic. For more information: [facility] [email or web address] This information is not intended to replace advice given to you by your health care provider. Make sure you discuss any  questions you have with your health care provider. Document Released: 01/14/2014 Document Revised: 05/16/2016 Document Reviewed: 09/16/2013 Elsevier Interactive Patient Education  2017 [employer].

## 2017-12-15 NOTE — Progress Notes (Signed)
Erin Byrd is a 27 y.o. female who presents today with concerns of blood tinged urine that began this AM and she reports seeing light blood tinged when she wipes after that. She denies trauma to the vaginal area and reports she could be having breakthrough bleeding due to missing a day of her birth control tablet. She denies low back pain but reports a sensation similar to cramping earlier today.  Review of Systems  Constitutional: Negative for chills, fever and malaise/fatigue.  HENT: Negative for congestion, ear discharge, ear pain, sinus pain and sore throat.   Eyes: Negative.   Respiratory: Negative for cough, sputum production and shortness of breath.   Cardiovascular: Negative.  Negative for chest pain.  Gastrointestinal: Negative for abdominal pain, diarrhea, nausea and vomiting.  Genitourinary: Negative for dysuria, frequency, hematuria and urgency.  Musculoskeletal: Negative for myalgias.  Skin: Negative.   Neurological: Negative for headaches.  Endo/Heme/Allergies: Negative.   Psychiatric/Behavioral: Negative.     O: Vitals:   12/15/17 1114  BP: 125/70  Pulse: 73  Resp: 16  Temp: 97.9 F (36.6 C)  SpO2: 99%     Physical Exam  Constitutional: She is oriented to person, place, and time. Vital signs are normal. She appears well-developed and well-nourished. She is active.  Non-toxic appearance. She does not have a sickly appearance.  HENT:  Head: Normocephalic.  Right Ear: Hearing, tympanic membrane, external ear and ear canal normal.  Left Ear: Hearing, tympanic membrane, external ear and ear canal normal.  Nose: Nose normal.  Mouth/Throat: Uvula is midline and oropharynx is clear and moist.  Neck: Normal range of motion. Neck supple.  Cardiovascular: Normal rate, regular rhythm, normal heart sounds and normal pulses.  Pulmonary/Chest: Effort normal and breath sounds normal.  Abdominal: Soft. Bowel sounds are normal.  Musculoskeletal: Normal range of motion.   Lymphadenopathy:       Head (right side): No submental and no submandibular adenopathy present.       Head (left side): No submental and no submandibular adenopathy present.    She has no cervical adenopathy.  Neurological: She is alert and oriented to person, place, and time.  Psychiatric: She has a normal mood and affect.  Vitals reviewed.  A: 1. Hematuria, unspecified type    P: Exam findings, diagnosis etiology and medication use and indications reviewed with patient. Follow- Up and discharge instructions provided. No emergent/urgent issues found on exam.  Patient verbalized understanding of information provided and agrees with plan of care (POC), all questions answered.  PLAN< F/U with PCP I n 4-6 week for repeat urine/with micro (avoid vigorous physical activity and not menstruating prior to lab) Slight hematuria- negative HCG and UTI Advised to monitor for flank pain,fever, and edema  1. Hematuria, unspecified type - POCT urinalysis dipstick - POCT urine pregnancy Results for orders placed or performed in visit on 12/15/17 (from the past 24 hour(s))  POCT urinalysis dipstick     Status: Abnormal   Collection Time: 12/15/17 11:55 AM  Result Value Ref Range   Color, UA YELLOW    Clarity, UA CLEAR    Glucose, UA Negative Negative   Bilirubin, UA NEGATIVE    Ketones, UA NEGATVE    Spec Grav, UA 1.010 1.010 - 1.025   Blood, UA LARGE +++    pH, UA 7.0 5.0 - 8.0   Protein, UA Negative Negative   Urobilinogen, UA 0.2 0.2 or 1.0 E.U./dL   Nitrite, UA NEGATIVE    Leukocytes, UA Large (3+) (A)  Negative   Appearance     Odor    POCT urine pregnancy     Status: Normal   Collection Time: 12/15/17 11:55 AM  Result Value Ref Range   Preg Test, Ur Negative Negative

## 2017-12-17 ENCOUNTER — Encounter: Payer: Self-pay | Admitting: Physician Assistant

## 2017-12-18 DIAGNOSIS — Z3202 Encounter for pregnancy test, result negative: Secondary | ICD-10-CM | POA: Diagnosis not present

## 2017-12-18 DIAGNOSIS — N921 Excessive and frequent menstruation with irregular cycle: Secondary | ICD-10-CM | POA: Diagnosis not present

## 2017-12-18 DIAGNOSIS — Z793 Long term (current) use of hormonal contraceptives: Secondary | ICD-10-CM | POA: Diagnosis not present

## 2018-01-09 DIAGNOSIS — R350 Frequency of micturition: Secondary | ICD-10-CM | POA: Diagnosis not present

## 2018-01-09 DIAGNOSIS — R195 Other fecal abnormalities: Secondary | ICD-10-CM | POA: Diagnosis not present

## 2018-01-09 DIAGNOSIS — R102 Pelvic and perineal pain: Secondary | ICD-10-CM | POA: Diagnosis not present

## 2018-01-09 DIAGNOSIS — N390 Urinary tract infection, site not specified: Secondary | ICD-10-CM | POA: Diagnosis not present

## 2018-02-02 ENCOUNTER — Encounter (HOSPITAL_COMMUNITY): Payer: Self-pay | Admitting: Emergency Medicine

## 2018-02-02 ENCOUNTER — Other Ambulatory Visit: Payer: Self-pay

## 2018-02-02 ENCOUNTER — Ambulatory Visit (HOSPITAL_COMMUNITY)
Admission: EM | Admit: 2018-02-02 | Discharge: 2018-02-02 | Disposition: A | Discharge status: Home / Self Care | Payer: 59 | Attending: Family Medicine | Admitting: Family Medicine

## 2018-02-02 DIAGNOSIS — Z711 Person with feared health complaint in whom no diagnosis is made: Secondary | ICD-10-CM | POA: Diagnosis not present

## 2018-02-02 DIAGNOSIS — T192XXA Foreign body in vulva and vagina, initial encounter: Secondary | ICD-10-CM

## 2018-02-02 NOTE — ED Notes (Signed)
Patient undressed, ring forceps/speculum at bedside

## 2018-02-02 NOTE — ED Triage Notes (Signed)
Placed a tampon in around 3:00 pm yesterday.  Cannot remember taking it out.  Patient wants to be sure

## 2018-02-02 NOTE — Discharge Instructions (Signed)
No foreign body seen. Recheck as needed.

## 2018-02-03 NOTE — ED Provider Notes (Signed)
[name]    CSN: [id number] Arrival date & time: 02/02/18  1024     History   Chief Complaint Chief Complaint  Patient presents with  . Foreign Body in Vagina    HPI Erin Byrd is a 27 y.o. female.   27 year old female comes in for possible foreign body to the vagina. States that put a tampon in yesterday and was unable to feel the string and cannot remember if she took the tampon out. She denies abdominal pain, nausea, vomiting. Denies fever, chills, night sweats. Denies vaginal discharge, itching, pain.      History reviewed. No pertinent past medical history.  Patient Active Problem List   Diagnosis Date Noted  . Rectal bleeding 07/25/2017  . Rectal pain 07/25/2017  . Generalized anxiety disorder 11/24/2016  . Acute appendicitis s/p lap appendectomy 11/15/2016 11/15/2016  . Essential tremor 04/10/2014  . Eustachian tube dysfunction 04/10/2014  . External hemorrhoid 04/10/2014    Past Surgical History:  Procedure Laterality Date  . APPENDECTOMY    . ARM WOUND REPAIR / CLOSURE  2016   broken arm  . LAPAROSCOPIC APPENDECTOMY N/A 11/15/2016   Procedure: APPENDECTOMY LAPAROSCOPIC;  Surgeon: Armandina Gemma, MD;  Location: [facility];  Service: General;  Laterality: N/A;  . WISDOM TOOTH EXTRACTION      OB History   None      Home Medications    Prior to Admission medications   Medication Sig Start Date End Date Taking? Authorizing Provider  acetaminophen (TYLENOL) 325 MG tablet Take 2 tablets (650 mg total) by mouth every 6 (six) hours as needed for mild pain (or temp > 100). 11/16/16   Earnstine Regal, PA-C  clonazePAM (KLONOPIN) 0.5 MG tablet Take 1 tablet (0.5 mg total) by mouth at bedtime. 10/31/17   Inda Coke, PA  fluticasone (FLONASE) 50 MCG/ACT nasal spray Place 2 sprays into both nostrils daily. 04/19/17   Inda Coke, PA  hydrocortisone (ANUSOL-HC) 2.5 % rectal cream Place 1 application rectally 2 (two) times daily. 07/25/17   Zehr,  Laban Emperor, PA-C  hydrocortisone (ANUSOL-HC) 25 MG suppository Place 1 suppository (25 mg total) rectally 2 (two) times daily. Patient not taking: Reported on 10/31/2017 07/20/17   Briscoe Deutscher, DO  ibuprofen (ADVIL,MOTRIN) 200 MG tablet You can take 2-3 tablets every 6 hours as needed.  You can alternate with Tylenol as needed or you can use prescribed pain medicine for an alternate pain medicine. 11/16/16   Earnstine Regal, PA-C  norethindrone-ethinyl estradiol (JUNEL FE,GILDESS FE,LOESTRIN FE) 1-20 MG-MCG tablet Take 1 tablet by mouth daily.    [provider]  propranolol (INDERAL) 20 MG tablet Take 2 tablets in the morning, 1 in the afternoon 12/13/17   Tat, Eustace Quail, DO    Family History Family History  Problem Relation Age of Onset  . Hypertension Mother   . Hypertension Father   . Hypertension Paternal Grandmother   . Hypertension Paternal Grandfather   . Diabetes Paternal Grandfather   . Breast cancer Neg Hx   . Colon cancer Neg Hx     Social History Social History   Tobacco Use  . Smoking status: Never Smoker  . Smokeless tobacco: Never Used  Substance Use Topics  . Alcohol use: Yes    Alcohol/week: 1.2 - 1.8 oz    Types: 2 - 3 Cans of beer per week  . Drug use: No     Allergies   Erythromycin and Penicillins   Review of Systems Review  of Systems  Reason unable to perform ROS: See HPI as above.     Physical Exam Triage Vital Signs ED Triage Vitals  Enc Vitals Group     BP 02/02/18 1121 (!) 161/95     Pulse Rate 02/02/18 1121 81     Resp 02/02/18 1121 18     Temp 02/02/18 1121 98.3 F (36.8 C)     Temp Source 02/02/18 1121 Oral     SpO2 02/02/18 1121 100 %     Weight --      Height --      Head Circumference --      Peak Flow --      Pain Score 02/02/18 1119 0     Pain Loc --      Pain Edu? --      Excl. in [place]? --    No data found.  Updated Vital Signs BP (!) 161/95 (BP Location: Left Arm)   Pulse 81   Temp 98.3 F (36.8 C) (Oral)    Resp 18   LMP 02/02/2018   SpO2 100%   Physical Exam  Constitutional: She is oriented to person, place, and time. She appears well-developed and well-nourished. No distress.  HENT:  Head: Normocephalic and atraumatic.  Eyes: Pupils are equal, round, and reactive to light. Conjunctivae are normal.  Genitourinary: Vagina normal. There is no rash or tenderness on the right labia. There is no rash or tenderness on the left labia. Cervix exhibits no discharge. No foreign body in the vagina.  Neurological: She is alert and oriented to person, place, and time.  Skin: She is not diaphoretic.     UC Treatments / Results  Labs (all labs ordered are listed, but only abnormal results are displayed) Labs Reviewed - No data to display  EKG None  Radiology No results found.  Procedures Procedures (including critical care time)  Medications Ordered in UC Medications - No data to display  Initial Impression / Assessment and Plan / UC Course  I have reviewed the triage vital signs and the nursing notes.  Pertinent labs & imaging results that were available during my care of the patient were reviewed by me and considered in my medical decision making (see chart for details).    No foreign body seen. Patient asymptomatic. Recheck as needed.   Final Clinical Impressions(s) / UC Diagnoses   Final diagnoses:  Worried well   ED Prescriptions    None        Ok Edwards, PA-C 02/03/18 1012

## 2018-02-04 NOTE — Progress Notes (Signed)
Subjective:   Erin Byrd was seen in consultation in the movement disorder clinic at the request of Inda Coke, [place].  The evaluation is for tremor.  The records that were made available to me were reviewed.  [facility]  She last saw him in December, 2016.  He diagnosed her with essential tremor.  Tremor started when she was approximately 27 years old.  She was given propranolol, 20 mg, to use in approximately 2011 on a prn basis, perhaps when she was giving speeches or when she felt more nervous.  Over the course of time, she started to use propranolol 20 mg daily.  She is taking it one to two times per day.  She notes no side effects with the medication.  Symptoms involve the bilateral upper extremities.  Tremor is most noticeable when speaking publicly, under stressed, if she fatigued.   There is a family hx of tremor when she was younger but that got better.  Her father seems to tremor a little.  Her paternal GM had some tremor in later years.    Affected by caffeine:  Yes.    Affected by alcohol:  Yes.   Affected by stress:  Yes.   Affected by fatigue:  Yes.   Spills soup if on spoon:  No. Spills glass of liquid if full:  No. Affects ADL's (tying shoes, brushing teeth, etc):  No.  Current/Previously tried tremor medications: propranolol  Current medications that may exacerbate tremor:  n/a  Outside reports reviewed: historical medical records, office notes and referral letter/letters.  06/01/17 update: Patient seen today in follow-up.  I increased her propranolol last visit to 40 mg in the morning and 20 mg at night.  She did email me to see if that would cause weight gain, but I told her that would not.  She has lost 11 lbs since our last visit.  States that she is trying as she is getting married in 3 months.  Minimal SE with medication - perhaps little fatigue with 3rd dosage.  Overall, she is happy with this increase in  medication dose.  She really thinks it has helped.  02/05/18 update: Patient is seen today in follow-up for tremor.  I had originally changed her from propranolol, 40 mg in the morning and 20 mg at night to Inderal LA 60 mg daily.  However, she thought the regular release worked better and we ultimately changed her back.  Records are reviewed since her last visit.  Had her last physical exam on Oct 31, 2017.  Reports that she did get married since our last visit.  She does not want to have kids right now.  She is using oral birth control.  Allergies  Allergen Reactions  . Erythromycin Nausea And Vomiting and Other (See Comments)    Reaction:  Abdominal pain  . Penicillins Rash and Other (See Comments)    Has patient had a PCN reaction causing immediate rash, facial/tongue/throat swelling, SOB or lightheadedness with hypotension: No Has patient had a PCN reaction causing severe rash involving mucus membranes or skin necrosis: No Has patient had a PCN reaction that required hospitalization No Has patient had a PCN reaction occurring within the last 10 years: No If all of the above answers are "NO", then may proceed with Cephalosporin use.    Outpatient Encounter Medications as of 02/05/2018  Medication Sig  . acetaminophen (TYLENOL) 325 MG tablet Take 2 tablets (650 mg  total) by mouth every 6 (six) hours as needed for mild pain (or temp > 100).  . clonazePAM (KLONOPIN) 0.5 MG tablet Take 1 tablet (0.5 mg total) by mouth at bedtime.  . fluticasone (FLONASE) 50 MCG/ACT nasal spray Place 2 sprays into both nostrils daily.  . hydrocortisone (ANUSOL-HC) 2.5 % rectal cream Place 1 application rectally 2 (two) times daily.  Marland Kitchen ibuprofen (ADVIL,MOTRIN) 200 MG tablet You can take 2-3 tablets every 6 hours as needed.  You can alternate with Tylenol as needed or you can use prescribed pain medicine for an alternate pain medicine.  . norethindrone-ethinyl estradiol (JUNEL FE,GILDESS FE,LOESTRIN FE) 1-20 MG-MCG  tablet Take 1 tablet by mouth daily.  . propranolol (INDERAL) 20 MG tablet Take 2 tablets in the morning, 1 in the afternoon  . [DISCONTINUED] hydrocortisone (ANUSOL-HC) 25 MG suppository Place 1 suppository (25 mg total) rectally 2 (two) times daily. (Patient not taking: Reported on 10/31/2017)   No facility-administered encounter medications on file as of 02/05/2018.     History reviewed. No pertinent past medical history.  Past Surgical History:  Procedure Laterality Date  . APPENDECTOMY    . ARM WOUND REPAIR / CLOSURE  2016   broken arm  . LAPAROSCOPIC APPENDECTOMY N/A 11/15/2016   Procedure: APPENDECTOMY LAPAROSCOPIC;  Surgeon: Armandina Gemma, MD;  Location: [facility];  Service: General;  Laterality: N/A;  . WISDOM TOOTH EXTRACTION      Social History   Socioeconomic History  . Marital status: Single    Spouse name: Not on file  . Number of children: 0  . Years of education: 34  . Highest education level: Not on file  Occupational History  . Occupation: Quarry manager  Social Needs  . Financial resource strain: Not on file  . Food insecurity:    Worry: Not on file    Inability: Not on file  . Transportation needs:    Medical: Not on file    Non-medical: Not on file  Tobacco Use  . Smoking status: Never Smoker  . Smokeless tobacco: Never Used  Substance and Sexual Activity  . Alcohol use: Yes    Alcohol/week: 1.2 - 1.8 oz    Types: 2 - 3 Cans of beer per week  . Drug use: No  . Sexual activity: Yes    Birth control/protection: Pill  Lifestyle  . Physical activity:    Days per week: Not on file    Minutes per session: Not on file  . Stress: Not on file  Relationships  . Social connections:    Talks on phone: Not on file    Gets together: Not on file    Attends religious service: Not on file    Active member of club or organization: Not on file    Attends meetings of clubs or organizations: Not on file    Relationship status: Not on file  . Intimate partner  violence:    Fear of current or ex partner: Not on file    Emotionally abused: Not on file    Physically abused: Not on file    Forced sexual activity: Not on file  Other Topics Concern  . Not on file  Social History Narrative   Fun: Relax at home, go out with friends, shop    Family Status  Relation Name Status  . Mother  Alive  . Father  Alive  . PGM  Deceased  . MGF  Deceased  . MGM  Deceased  . PGF  Alive  .  Neg Hx  (Not Specified)    Review of Systems Review of Systems  Constitutional: Negative.   HENT: Negative.   Eyes: Negative.   Respiratory: Negative.   Cardiovascular: Negative.   Skin: Negative.   Neurological: Positive for tremors.  '   Objective:   VITALS:   Vitals:   02/05/18 1534  BP: 112/76  Pulse: 70  SpO2: 98%  Weight: 157 lb (71.2 kg)  Height: 5\' 7"  (1.702 m)   Wt Readings from Last 3 Encounters:  02/05/18 157 lb (71.2 kg)  12/15/17 152 lb 9.6 oz (69.2 kg)  10/31/17 156 lb 8 oz (71 kg)     GEN:  The patient appears stated age and is in NAD. HEENT:  Normocephalic, atraumatic.  The mucous membranes are moist. The superficial temporal arteries are without ropiness or tenderness. CV:  RRR Lungs:  CTAB Neck/HEME:  There are no carotid bruits bilaterally.  Neurological examination:  Orientation: The patient is alert and oriented x3. Cranial nerves: There is good facial symmetry.  Pupils are equal and reactive.  Funduscopic exam reveals clear disc margins bilaterally.  The speech is fluent and clear. Soft palate rises symmetrically and there is no tongue deviation. Hearing is intact to conversational tone. Sensation: Sensation is intact to light touch throughout Motor: Strength is 5/5 in the bilateral upper and lower extremities.   Shoulder shrug is equal and symmetric.  There is no pronator drift.  Movement examination: Tone: There is normal tone in the upper and lower extremities. Abnormal movements: No rest tremor.  There is postural  tremor, primarily on the left. Coordination:  There is no decremation with RAM's, with any form of RAMS, including alternating supination and pronation of the forearm, hand opening and closing, finger taps, heel taps and toe taps. Gait and Station: The patient has no difficulty arising out of a deep-seated chair without the use of the hands. The patient's stride length is normal.     Labs    Chemistry      Component Value Date/Time   NA 139 11/07/2017 0725   K 4.6 11/07/2017 0725   CL 105 11/07/2017 0725   CO2 27 11/07/2017 0725   BUN 11 11/07/2017 0725   CREATININE 0.77 11/07/2017 0725      Component Value Date/Time   CALCIUM 9.3 11/07/2017 0725   ALKPHOS 41 11/07/2017 0725   AST 13 11/07/2017 0725   ALT 10 11/07/2017 0725   BILITOT 0.4 11/07/2017 0725     Lab Results  Component Value Date   TSH 0.95 09/21/2016   Lab Results  Component Value Date   WBC 6.0 11/07/2017   HGB 13.3 11/07/2017   HCT 39.6 11/07/2017   MCV 88.0 11/07/2017   PLT 217.0 11/07/2017         Assessment/Plan:   1.  Essential Tremor, overall moderate in the L hand  -she still has some tremor but happy with current regimen.  Continue propranolol, 40 mg in the morning and 20 mg at night.  -importance of continued birth control discussed.  2.  F/u 1 year.  CC:  Inda Coke, [place]

## 2018-02-05 ENCOUNTER — Ambulatory Visit (INDEPENDENT_AMBULATORY_CARE_PROVIDER_SITE_OTHER): Payer: 59 | Admitting: Neurology

## 2018-02-05 ENCOUNTER — Encounter: Payer: Self-pay | Admitting: Neurology

## 2018-02-05 VITALS — BP 112/76 | HR 70 | Ht 67.0 in | Wt 157.0 lb

## 2018-02-05 DIAGNOSIS — G250 Essential tremor: Secondary | ICD-10-CM

## 2018-02-28 DIAGNOSIS — Z30011 Encounter for initial prescription of contraceptive pills: Secondary | ICD-10-CM | POA: Diagnosis not present

## 2018-02-28 DIAGNOSIS — Z01419 Encounter for gynecological examination (general) (routine) without abnormal findings: Secondary | ICD-10-CM | POA: Diagnosis not present

## 2018-02-28 DIAGNOSIS — Z1151 Encounter for screening for human papillomavirus (HPV): Secondary | ICD-10-CM | POA: Diagnosis not present

## 2018-02-28 MED FILL — TAYTULLA 1 MG-20 MCG CAP: 1-20 | 84 days supply | Qty: 84 | Fill #0

## 2018-07-17 MED FILL — TAYTULLA 1 MG-20 MCG CAP: 1-20 | 84 days supply | Qty: 84 | Fill #1

## 2018-08-07 MED ORDER — PROPRANOLOL HCL 20 MG PO TABS: ORAL_TABLET | ORAL | 1 refills | Status: DC

## 2018-08-07 MED FILL — PROPRANOLOL 20 MG TABLET: 20 | 90 days supply | Qty: 270 | Fill #0

## 2018-09-16 ENCOUNTER — Encounter: Payer: Self-pay | Admitting: Physician Assistant

## 2018-09-18 ENCOUNTER — Other Ambulatory Visit: Payer: Self-pay | Admitting: Physician Assistant

## 2018-09-18 MED ORDER — HYDROCORTISONE ACETATE 25 MG RE SUPP: 25.0000 mg | Freq: Two times a day (BID) | RECTAL | 3 refills | Status: AC

## 2018-09-18 MED FILL — HEMMOREX-HC 25 MG SUPP: 25 | 6 days supply | Qty: 12 | Fill #0

## 2018-10-14 ENCOUNTER — Telehealth: Payer: Self-pay | Admitting: *Deleted

## 2018-10-14 NOTE — Telephone Encounter (Signed)
Left message on voicemail to call office. Needs virtual visit, follow up with [facility] for Anxiety.

## 2018-10-16 NOTE — Telephone Encounter (Signed)
Spoke to pt. appt scheduled

## 2018-10-24 MED FILL — TAYTULLA 1 MG-20 MCG CAP: 1-20 | 84 days supply | Qty: 84 | Fill #2

## 2018-11-01 ENCOUNTER — Encounter: Payer: Self-pay | Admitting: Family Medicine

## 2018-11-01 ENCOUNTER — Other Ambulatory Visit: Payer: Self-pay

## 2018-11-01 ENCOUNTER — Ambulatory Visit: Payer: 59 | Admitting: Physician Assistant

## 2018-11-01 ENCOUNTER — Ambulatory Visit (INDEPENDENT_AMBULATORY_CARE_PROVIDER_SITE_OTHER): Payer: 59 | Admitting: Family Medicine

## 2018-11-01 DIAGNOSIS — R3129 Other microscopic hematuria: Secondary | ICD-10-CM

## 2018-11-01 DIAGNOSIS — F411 Generalized anxiety disorder: Secondary | ICD-10-CM | POA: Diagnosis not present

## 2018-11-01 DIAGNOSIS — G250 Essential tremor: Secondary | ICD-10-CM | POA: Diagnosis not present

## 2018-11-01 DIAGNOSIS — K644 Residual hemorrhoidal skin tags: Secondary | ICD-10-CM | POA: Diagnosis not present

## 2018-11-01 NOTE — Progress Notes (Signed)
Virtual Visit via Video   Due to the COVID-19 pandemic, this visit was completed with telemedicine (audio/video) technology to reduce patient and provider exposure as well as to preserve personal protective equipment.   I connected with Erin Byrd and verified that I am speaking with the correct person using two identifiers. Location patient: Home Location provider: [facility], Office Persons participating in the virtual visit: Alixis Harmon, Briscoe Deutscher, DO   I discussed the limitations of evaluation and management by telemedicine and the availability of in person appointments. The patient expressed understanding and agreed to proceed.  Care Team   Patient Care Team: Inda Coke, [place] as PCP - General (Physician Assistant)  Subjective:   HPI:   1. Generalized anxiety disorder. Doing well. Uses Klonopin prn, needs rarely. Exercising daily. Eating well. No trouble with sleep. Working from home.    2. Essential tremor. Controlled. Followed by Dr. Carles Collet. Taking Propranolol - 2 in the am and 1 in the afternoon. Sometimes holds that afternoon dose because it makes her tired and she exercises at that time.    3. External hemorrhoid. Ongoing issue since her early 57s. Had "bad" bowel movement a few weeks ago and bleeding from rectal area. Used Anusol suppository which is normally helpful, but caused itching or more pain. Resolved already. Questions why this time was different.     4. Microscopic hematuria. New. Reported by reliable patient. Urine completed at GYN office for f/u of intermenstrual spotting that was found to be due to missing her OCP. Spotting resolved prior to UA. Genito-Urinary ROS: negative for - change in urinary stream, dyspareunia, dysuria, genital discharge, genital ulcers, incontinence, nocturia, pelvic pain or urinary frequency/urgency. Patient says that her mother has the same.    Review of Systems  Constitutional:  Negative for chills, fever, malaise/fatigue and weight loss.  Respiratory: Negative for cough, shortness of breath and wheezing.   Cardiovascular: Negative for chest pain, palpitations and leg swelling.  Gastrointestinal: Negative for abdominal pain, constipation, diarrhea, nausea and vomiting.  Genitourinary: Negative for dysuria and urgency.  Musculoskeletal: Negative for joint pain and myalgias.  Skin: Negative for rash.  Neurological: Negative for dizziness and headaches.  Psychiatric/Behavioral: Negative for depression, substance abuse and suicidal ideas. The patient is not nervous/anxious.     Patient Active Problem List   Diagnosis Date Noted  . Microscopic hematuria 11/02/2018  . Generalized anxiety disorder 11/24/2016  . Acute appendicitis s/p lap appendectomy 11/15/2016 11/15/2016  . Essential tremor 04/10/2014  . External hemorrhoid 04/10/2014    Social History   Tobacco Use  . Smoking status: Never Smoker  . Smokeless tobacco: Never Used  Substance Use Topics  . Alcohol use: Yes    Alcohol/week: 2.0 - 3.0 standard drinks    Types: 2 - 3 Cans of beer per week    Current Outpatient Medications:  .  acetaminophen (TYLENOL) 325 MG tablet, Take 2 tablets (650 mg total) by mouth every 6 (six) hours as needed for mild pain (or temp > 100)., Disp: , Rfl:  .  clonazePAM (KLONOPIN) 0.5 MG tablet, Take 1 tablet (0.5 mg total) by mouth at bedtime., Disp: 30 tablet, Rfl: 0 .  fluticasone (FLONASE) 50 MCG/ACT nasal spray, Place 2 sprays into both nostrils daily., Disp: 16 g, Rfl: 2 .  hydrocortisone (ANUSOL-HC) 2.5 % rectal cream, Place 1 Byrd rectally 2 (two) times daily., Disp: 30 g, Rfl: 1 .  hydrocortisone (ANUSOL-HC) 25 MG suppository, Place 1  suppository (25 mg total) rectally 2 (two) times daily., Disp: 12 suppository, Rfl: 3 .  ibuprofen (ADVIL,MOTRIN) 200 MG tablet, You can take 2-3 tablets every 6 hours as needed.  You can alternate with Tylenol as needed or you can  use prescribed pain medicine for an alternate pain medicine., Disp: , Rfl:  .  norethindrone-ethinyl estradiol (JUNEL FE,GILDESS FE,LOESTRIN FE) 1-20 MG-MCG tablet, Take 1 tablet by mouth daily., Disp: , Rfl:  .  propranolol (INDERAL) 20 MG tablet, Take 2 tablets in the morning, 1 in the afternoon, Disp: 270 tablet, Rfl: 1  Allergies  Allergen Reactions  . Erythromycin Nausea And Vomiting and Other (See Comments)    Reaction:  Abdominal pain  . Penicillins Rash and Other (See Comments)    Has patient had a PCN reaction causing immediate rash, facial/tongue/throat swelling, SOB or lightheadedness with hypotension: No Has patient had a PCN reaction causing severe rash involving mucus membranes or skin necrosis: No Has patient had a PCN reaction that required hospitalization No Has patient had a PCN reaction occurring within the last 10 years: No If all of the above answers are "NO", then may proceed with Cephalosporin use.    Objective:   VITALS: Per patient if applicable, see vitals. GENERAL: Alert, appears well and in no acute distress. HEENT: Atraumatic, conjunctiva clear, no obvious abnormalities on inspection of external nose and ears. NECK: Normal movements of the head and neck. CARDIOPULMONARY: No increased WOB. Speaking in clear sentences. I:E ratio WNL.  MS: Moves all visible extremities without noticeable abnormality. PSYCH: Pleasant and cooperative, well-groomed. Speech normal rate and rhythm. Affect is appropriate. Insight and judgement are appropriate. Attention is focused, linear, and appropriate.  NEURO: CN grossly intact. Oriented as arrived to appointment on time with no prompting. Moves both UE equally.  SKIN: No obvious lesions, wounds, erythema, or cyanosis noted on face or hands.  Depression screen [facility] 2/9 10/31/2017 11/24/2016 07/01/2016  Decreased Interest 0 0 0  Down, Depressed, Hopeless 0 0 0  PHQ - 2 Score 0 0 0    Assessment and Plan:   Jurline was seen today  for anxiety.  Diagnoses and all orders for this visit:  Generalized anxiety disorder Comments: Doing well. Continue current treatment.   Essential tremor Comments: Doing well.Continue current treatment.   External hemorrhoid Comments: Patient likely had fissure during last episode of rectal pain and bleeding. Reviewed bowel regimen. She has already started Metamucil and found it to be helpful. Encouraged her to use it daily. Okay Vaseline or Bacitracin if she has another fissure. Reviewed other topical medications that can be used as well.    Microscopic hematuria Comments: New. Low risk and with possible vaginal and rectal source of blood. Patient due for CPE with PCP. Will recommend that UA with micro and UCx be added to labs.   Marland Kitchen COVID-19 Education: The signs and symptoms of COVID-19 were discussed with the patient and how to seek care for testing if needed. The importance of social distancing was discussed today. . Reviewed expectations re: course of current medical issues. . Discussed self-management of symptoms. . Outlined signs and symptoms indicating need for more acute intervention. . Patient verbalized understanding and all questions were answered. Marland Kitchen Health Maintenance issues including appropriate healthy diet, exercise, and smoking avoidance were discussed with patient. . See orders for this visit as documented in the electronic medical record.  Briscoe Deutscher, DO  Records requested if needed. Time spent: 25 minutes, of which >50% was spent  in obtaining information about her symptoms, reviewing her previous labs, evaluations, and treatments, counseling her about her condition (please see the discussed topics above), and developing a plan to further investigate it; she had a number of questions which I addressed.

## 2018-11-02 ENCOUNTER — Encounter: Payer: Self-pay | Admitting: Family Medicine

## 2018-11-02 DIAGNOSIS — R3129 Other microscopic hematuria: Secondary | ICD-10-CM | POA: Insufficient documentation

## 2018-11-02 NOTE — Patient Instructions (Signed)

## 2018-11-07 DIAGNOSIS — H52203 Unspecified astigmatism, bilateral: Secondary | ICD-10-CM | POA: Diagnosis not present

## 2018-11-07 DIAGNOSIS — H5213 Myopia, bilateral: Secondary | ICD-10-CM | POA: Diagnosis not present

## 2018-11-12 DIAGNOSIS — D485 Neoplasm of uncertain behavior of skin: Secondary | ICD-10-CM | POA: Diagnosis not present

## 2018-11-12 DIAGNOSIS — D2272 Melanocytic nevi of left lower limb, including hip: Secondary | ICD-10-CM | POA: Diagnosis not present

## 2018-11-12 DIAGNOSIS — D2271 Melanocytic nevi of right lower limb, including hip: Secondary | ICD-10-CM | POA: Diagnosis not present

## 2018-11-12 DIAGNOSIS — D225 Melanocytic nevi of trunk: Secondary | ICD-10-CM | POA: Diagnosis not present

## 2018-11-12 DIAGNOSIS — D2371 Other benign neoplasm of skin of right lower limb, including hip: Secondary | ICD-10-CM | POA: Diagnosis not present

## 2018-11-12 DIAGNOSIS — D2239 Melanocytic nevi of other parts of face: Secondary | ICD-10-CM | POA: Diagnosis not present

## 2019-01-03 IMAGING — CT CT ABD-PELV W/ CM
2 of 4 series · 15 of 46 positions shown, 17 images · IV contrast (APPLIED)
Comparison: None.

CLINICAL DATA: Right lower quadrant pain, onset today.

EXAM:
CT ABDOMEN AND PELVIS WITH CONTRAST
TECHNIQUE: Multidetector CT imaging of the abdomen and pelvis was performed
using the standard protocol following bolus administration of
intravenous contrast.
CONTRAST:  100mL D50WD3-WKK IOPAMIDOL (D50WD3-WKK) INJECTION 61%

[Series 2: axial st · axial · 0.77mm/px · z∈[-468,-44]mm · 12 of 93 slices shown, 14 images]
[im 4/93  soft-tissue]
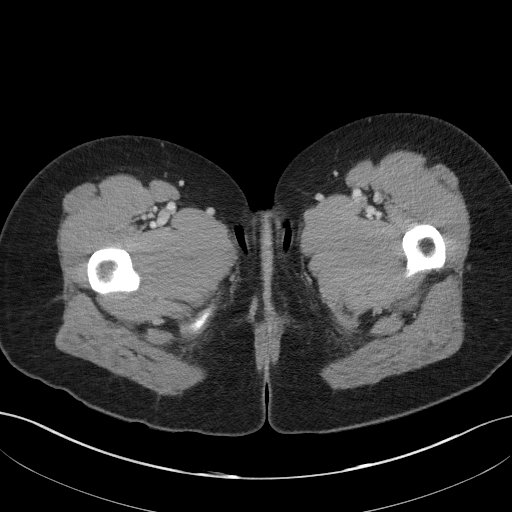
[im 4/93  bone]
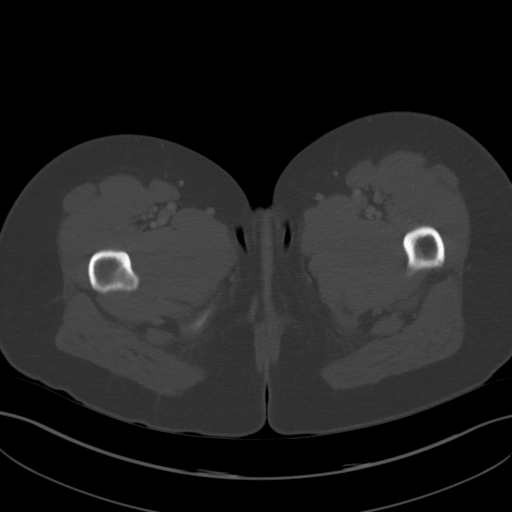
[im 12/93  soft-tissue]
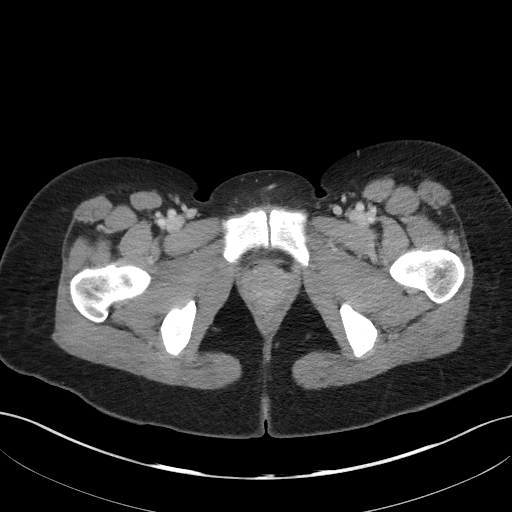
[im 20/93  soft-tissue]
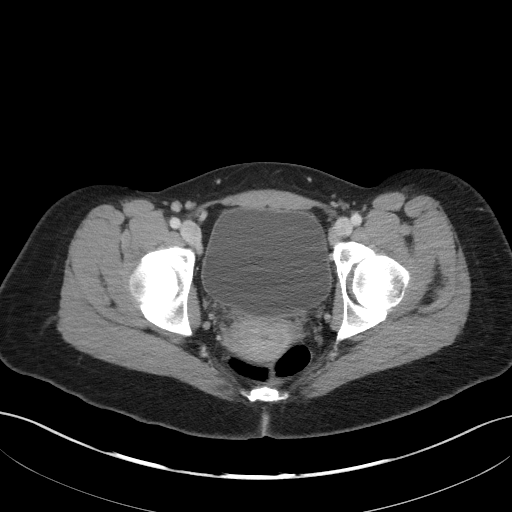
[im 27/93  soft-tissue]
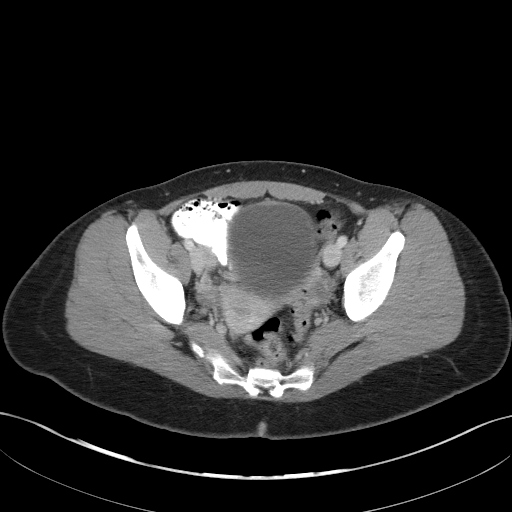
[im 35/93  soft-tissue]
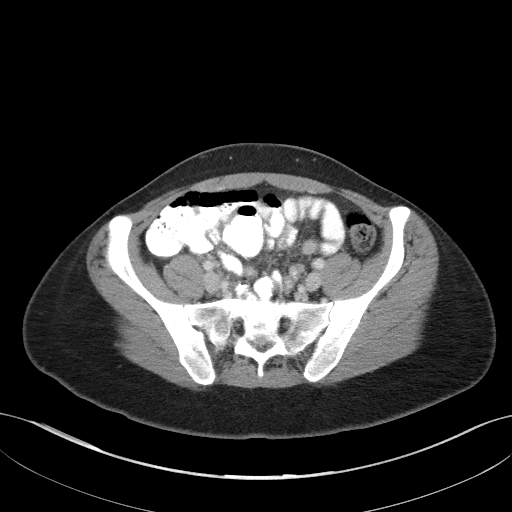
[im 43/93  soft-tissue]
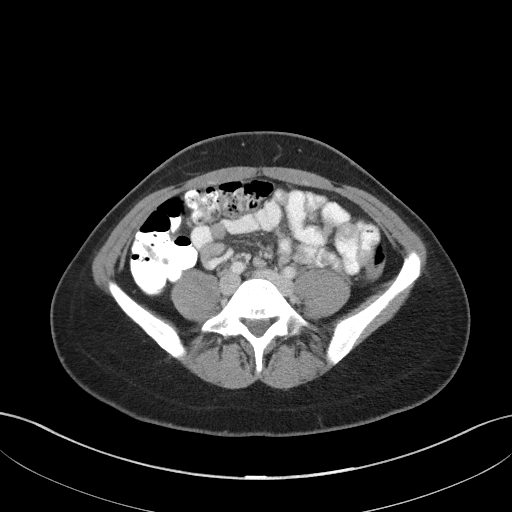
[im 50/93  soft-tissue]
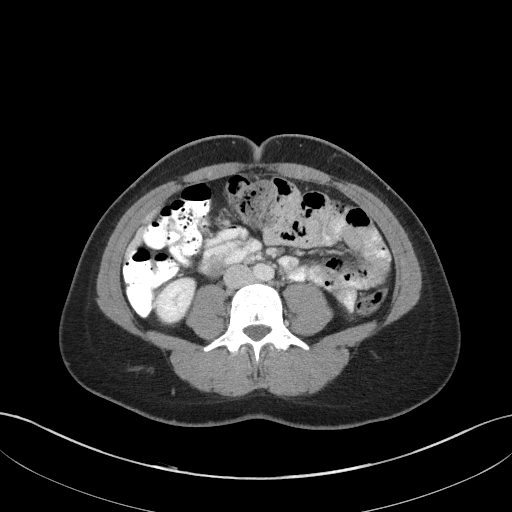
[im 58/93  soft-tissue]
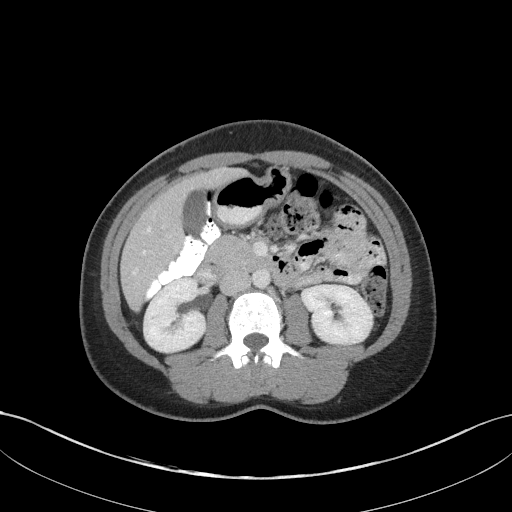
[im 66/93  soft-tissue]
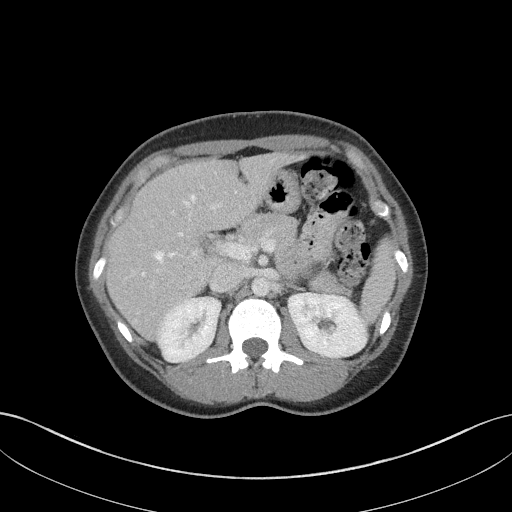
[im 66/93  bone]
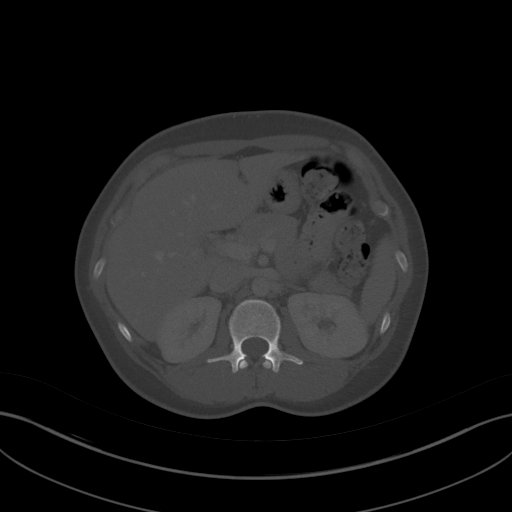
[im 73/93  soft-tissue]
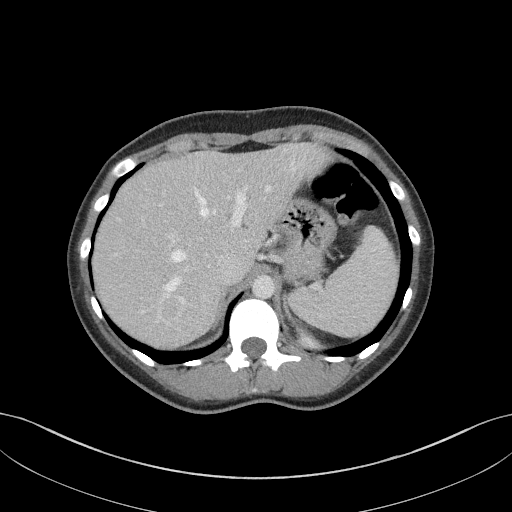
[im 81/93  soft-tissue]
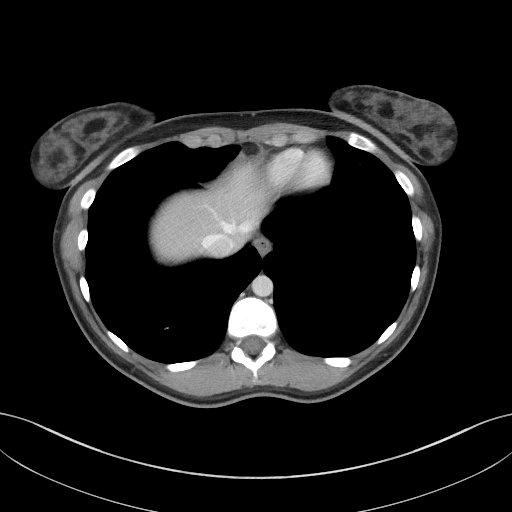
[im 89/93  soft-tissue]
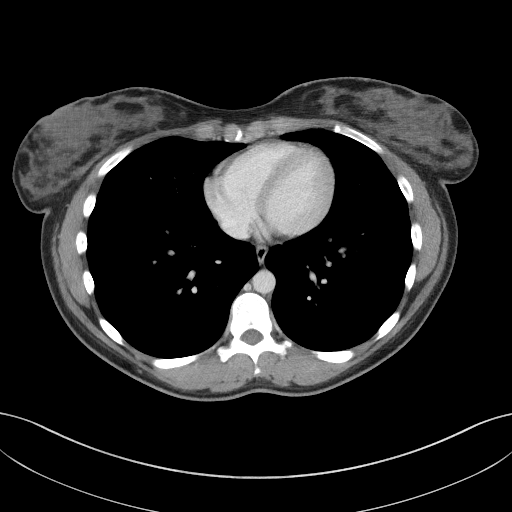

[Series 5: coronal st · coronal · 0.58mm/px · 3 of 73 slices shown]
[im 25/73  soft-tissue]
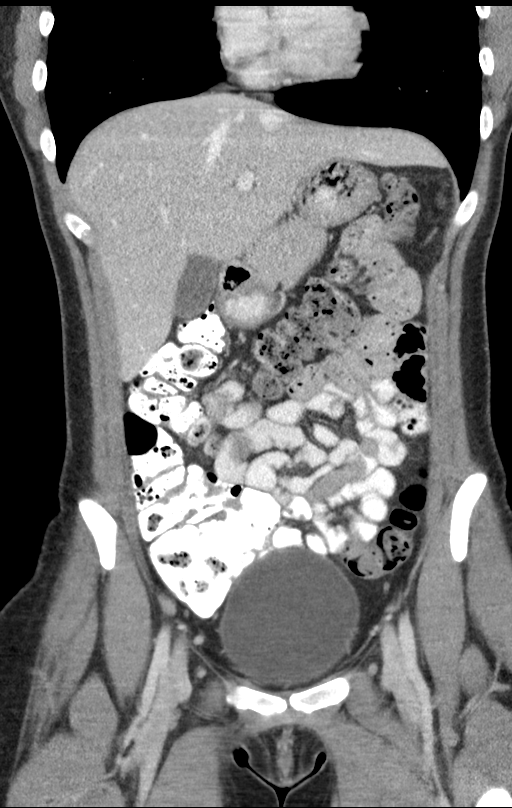
[im 33/73  soft-tissue]
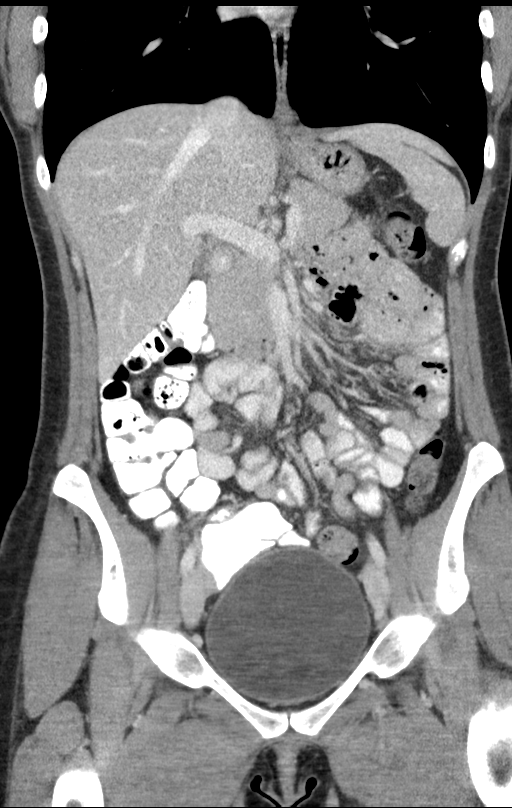
[im 41/73  soft-tissue]
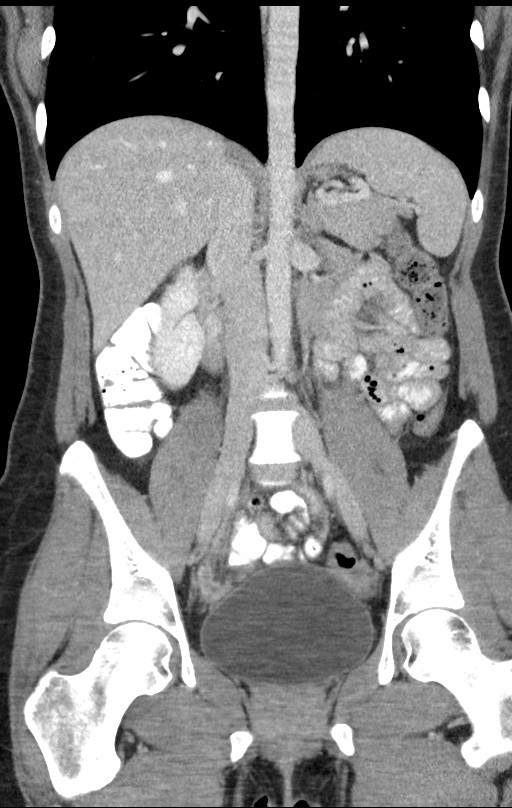

[15 of 46 positions shown; findings below may reference images not displayed]

FINDINGS: Lower chest: Lung bases are clear. Minimal fissural thickening at
the right minor fissure.

Hepatobiliary: No focal liver abnormality is seen. No gallstones,
gallbladder wall thickening, or biliary dilatation.

Pancreas: No ductal dilatation or inflammation.

Spleen: Normal in size without focal abnormality.

Adrenals/Urinary Tract: Adrenal glands are unremarkable. Kidneys are
normal, without renal calculi, focal lesion, or hydronephrosis.
Bladder is unremarkable.

Stomach/Bowel: The appendix is dilated measuring 9 mm, fluid-filled
with wall thickening and surrounding periappendiceal fat stranding.
No appendicolith. No perforation or abscess. Appendix is pes
characterized on coronal reformats. Stomach, small, and large bowel
appear unremarkable. No obstruction, wall thickening or
inflammation.

Vascular/Lymphatic: No significant vascular findings are present. No
enlarged abdominal or pelvic lymph nodes.

Reproductive: Uterus and bilateral adnexa are unremarkable.

Other: Trace free fluid in the pelvis. No free air. No intra-
abdominal abscess.

Musculoskeletal: There are no acute or suspicious osseous
abnormalities.
IMPRESSION: Uncomplicated acute appendicitis.

## 2019-01-20 MED FILL — TAYTULLA 1 MG-20 MCG CAP: 1-20 | 84 days supply | Qty: 84 | Fill #3

## 2019-02-05 ENCOUNTER — Encounter: Payer: Self-pay | Admitting: Physician Assistant

## 2019-02-05 ENCOUNTER — Other Ambulatory Visit: Payer: Self-pay

## 2019-02-05 ENCOUNTER — Ambulatory Visit (INDEPENDENT_AMBULATORY_CARE_PROVIDER_SITE_OTHER): Payer: 59 | Admitting: Physician Assistant

## 2019-02-05 VITALS — BP 120/80 | HR 69 | Temp 98.7°F | Ht 67.0 in | Wt 156.0 lb

## 2019-02-05 DIAGNOSIS — Z13220 Encounter for screening for lipoid disorders: Secondary | ICD-10-CM | POA: Diagnosis not present

## 2019-02-05 DIAGNOSIS — Z0000 Encounter for general adult medical examination without abnormal findings: Secondary | ICD-10-CM

## 2019-02-05 DIAGNOSIS — R3129 Other microscopic hematuria: Secondary | ICD-10-CM

## 2019-02-05 DIAGNOSIS — F411 Generalized anxiety disorder: Secondary | ICD-10-CM

## 2019-02-05 DIAGNOSIS — Z136 Encounter for screening for cardiovascular disorders: Secondary | ICD-10-CM

## 2019-02-05 NOTE — Progress Notes (Signed)
I acted as a Education administrator for [employer], PA-C Anselmo Pickler, LPN  Subjective:    Erin Byrd is a 28 y.o. female and is here for a comprehensive physical exam.  HPI  Health Maintenance Due  Topic Date Due  . PAP-Cervical Cytology Screening  04/24/2012  . PAP SMEAR-Modifier  01/31/2018  . INFLUENZA VACCINE  02/01/2019    Acute Concerns: None  Chronic Issues: GAD -- doing well, takes klonopin prn and also uses propranolol for tremor, but does help with anxiety as well. Essential tremor -- followed by Dr. Carles Collet, continues on propranolol when needed, using less now because she is working from home by herself Microscopic hematuria -- evaluated in May by our office after patient was made aware of this by ob-gyn UA. Due for recheck. Has long standing family hx of blood in urine. She is not a smoker. Denies dysuria.  Health Maintenance: Immunizations -- UTD Colonoscopy -- N/A Mammogram -- N/A PAP -- done August, Normal per pt will have results sent Bone Density -- N/A Diet -- well balanced Caffeine intake -- daily Sleep habits -- poor Exercise -- trying to get back into it, recently bought a stationary bike Current Weight -- Weight: 156 lb (70.8 kg)  Weight History: Wt Readings from Last 10 Encounters:  02/05/19 156 lb (70.8 kg)  02/05/18 157 lb (71.2 kg)  12/15/17 152 lb 9.6 oz (69.2 kg)  10/31/17 156 lb 8 oz (71 kg)  07/25/17 155 lb (70.3 kg)  06/01/17 153 lb (69.4 kg)  02/16/17 164 lb (74.4 kg)  02/12/17 162 lb (73.5 kg)  11/24/16 160 lb (72.6 kg)  11/15/16 150 lb (68 kg)   Mood -- overall good Patient's last menstrual period was 01/23/2019. Birth control -- junel fe  Depression screen [facility] 2/9 02/05/2019  Decreased Interest 0  Down, Depressed, Hopeless 0  PHQ - 2 Score 0     Other providers/specialists: Patient Care Team: Inda Coke, [place] as PCP - General (Physician Assistant)   PMHx, SurgHx, SocialHx, Medications, and Allergies were reviewed in the  Visit Navigator and updated as appropriate.   History reviewed. No pertinent past medical history.   Past Surgical History:  Procedure Laterality Date  . APPENDECTOMY    . ARM WOUND REPAIR / CLOSURE  2016   broken arm  . LAPAROSCOPIC APPENDECTOMY N/A 11/15/2016   Procedure: APPENDECTOMY LAPAROSCOPIC;  Surgeon: Armandina Gemma, MD;  Location: [facility];  Service: General;  Laterality: N/A;  . WISDOM TOOTH EXTRACTION       Family History  Problem Relation Age of Onset  . Hypertension Mother   . Hypertension Father   . Hypertension Paternal Grandmother   . Hypertension Paternal Grandfather   . Diabetes Paternal Grandfather   . Breast cancer Neg Hx   . Colon cancer Neg Hx     Social History   Tobacco Use  . Smoking status: Never Smoker  . Smokeless tobacco: Never Used  Substance Use Topics  . Alcohol use: Yes    Alcohol/week: 2.0 - 3.0 standard drinks    Types: 2 - 3 Cans of beer per week  . Drug use: No    Review of Systems:   Review of Systems  Constitutional: Negative for chills, fever, malaise/fatigue and weight loss.  HENT: Negative for hearing loss, sinus pain and sore throat.   Respiratory: Negative for cough and hemoptysis.   Cardiovascular: Negative for chest pain, palpitations, leg swelling and PND.  Gastrointestinal: Negative for abdominal pain, constipation, diarrhea, heartburn,  nausea and vomiting.  Genitourinary: Negative for dysuria, frequency and urgency.  Musculoskeletal: Negative for back pain, myalgias and neck pain.  Skin: Negative for itching and rash.  Neurological: Negative for dizziness, tingling, seizures and headaches.  Endo/Heme/Allergies: Negative for polydipsia.  Psychiatric/Behavioral: Negative for depression. The patient is not nervous/anxious.     Objective:   BP 120/80 (BP Location: Left Arm, Patient Position: Sitting, Cuff Size: Normal)   Pulse 69   Temp 98.7 F (37.1 C) (Temporal)   Ht 5\' 7"  (1.702 m)   Wt 156 lb (70.8 kg)   LMP  01/23/2019   SpO2 99%   BMI 24.43 kg/m   General Appearance:    Alert, cooperative, no distress, appears stated age  Head:    Normocephalic, without obvious abnormality, atraumatic  Eyes:    PERRL, conjunctiva/corneas clear, EOM's intact, fundi    benign, both eyes  Ears:    Normal TM's and external ear canals, both ears  Nose:   Nares normal, septum midline, mucosa normal, no drainage    or sinus tenderness  Throat:   Lips, mucosa, and tongue normal; teeth and gums normal  Neck:   Supple, symmetrical, trachea midline, no adenopathy;    thyroid:  no enlargement/tenderness/nodules; no carotid   bruit or JVD  Back:     Symmetric, no curvature, ROM normal, no CVA tenderness  Lungs:     Clear to auscultation bilaterally, respirations unlabored  Chest Wall:    No tenderness or deformity   Heart:    Regular rate and rhythm, S1 and S2 normal, no murmur, rub   or gallop  Breast Exam:    Deferred  Abdomen:     Soft, non-tender, bowel sounds active all four quadrants,    no masses, no organomegaly  Genitalia:    Deferred  Rectal:    Deferred  Extremities:   Extremities normal, atraumatic, no cyanosis or edema  Pulses:   2+ and symmetric all extremities  Skin:   Skin color, texture, turgor normal, no rashes or lesions  Lymph nodes:   Cervical, supraclavicular, and axillary nodes normal  Neurologic:   CNII-XII intact, normal strength, sensation and reflexes    throughout    Assessment/Plan:   Anyi was seen today for annual exam.  Diagnoses and all orders for this visit:  Routine physical examination Today patient counseled on age appropriate routine health concerns for screening and prevention, each reviewed and up to date or declined. Immunizations reviewed and up to date or declined. Labs ordered and reviewed. Risk factors for depression reviewed and negative. Hearing function and visual acuity are intact. ADLs screened and addressed as needed. Functional ability and level of safety  reviewed and appropriate. Education, counseling and referrals performed based on assessed risks today. Patient provided with a copy of personalized plan for preventive services.  Microscopic hematuria Recheck today. Consider urology evaluation if needed. -     Urinalysis, Routine w reflex microscopic -     Urine Culture  Generalized anxiety disorder Overall well controlled.  -     CBC with Differential/Platelet -     Comprehensive metabolic panel  Encounter for lipid screening for cardiovascular disease -     Lipid panel    Well Adult Exam: Labs ordered: Yes. Patient counseling was done. See below for items discussed. Discussed the patient's BMI. The BMI is in the acceptable range Follow up in one year.  Patient Counseling:   [x]     Nutrition: Stressed importance of moderation in  sodium/caffeine intake, saturated fat and cholesterol, caloric balance, sufficient intake of fresh fruits, vegetables, fiber, calcium, iron, and 1 mg of folate supplement per day (for females capable of pregnancy).   [x]      Stressed the importance of regular exercise.    [x]     Substance Abuse: Discussed cessation/primary prevention of tobacco, alcohol, or other drug use; driving or other dangerous activities under the influence; availability of treatment for abuse.    [x]      Injury prevention: Discussed safety belts, safety helmets, smoke detector, smoking near bedding or upholstery.    [x]      Sexuality: Discussed sexually transmitted diseases, partner selection, use of condoms, avoidance of unintended pregnancy  and contraceptive alternatives.    [x]     Dental health: Discussed importance of regular tooth brushing, flossing, and dental visits.   [x]      Health maintenance and immunizations reviewed. Please refer to Health maintenance section.   CMA or LPN served as scribe during this visit. History, Physical, and Plan performed by medical provider. The above documentation has been reviewed  and is accurate and complete.   Inda Coke, PA-C [place]

## 2019-02-06 LAB — URINALYSIS, ROUTINE W REFLEX MICROSCOPIC
Bilirubin Urine: NEGATIVE
Hgb urine dipstick: NEGATIVE
Ketones, ur: NEGATIVE
Leukocytes,Ua: NEGATIVE
Nitrite: NEGATIVE
RBC / HPF: NONE SEEN (ref 0–?)
Specific Gravity, Urine: 1.01 (ref 1.000–1.030)
Total Protein, Urine: NEGATIVE
Urine Glucose: NEGATIVE
Urobilinogen, UA: 0.2 (ref 0.0–1.0)
WBC, UA: NONE SEEN (ref 0–?)
pH: 7 (ref 5.0–8.0)

## 2019-02-06 LAB — LIPID PANEL
Cholesterol: 156 mg/dL (ref 0–200)
HDL: 66.7 mg/dL (ref >39.00)
LDL Cholesterol: 76 mg/dL (ref 0–99)
NonHDL: 88.87
Total CHOL/HDL Ratio: 2
Triglycerides: 65 mg/dL (ref 0.0–149.0)
VLDL: 13 mg/dL (ref 0.0–40.0)

## 2019-02-06 LAB — COMPREHENSIVE METABOLIC PANEL
ALT: 10 U/L (ref 0–35)
AST: 14 U/L (ref 0–37)
Albumin: 4.4 g/dL (ref 3.5–5.2)
Alkaline Phosphatase: 49 U/L (ref 39–117)
BUN: 7 mg/dL (ref 6–23)
CO2: 27 mEq/L (ref 19–32)
Calcium: 9.6 mg/dL (ref 8.4–10.5)
Chloride: 104 mEq/L (ref 96–112)
Creatinine, Ser: 0.7 mg/dL (ref 0.40–1.20)
GFR: 99.79 mL/min (ref >60.00)
Glucose, Bld: 83 mg/dL (ref 70–99)
Potassium: 4.2 mEq/L (ref 3.5–5.1)
Sodium: 138 mEq/L (ref 135–145)
Total Bilirubin: 0.3 mg/dL (ref 0.2–1.2)
Total Protein: 7.2 g/dL (ref 6.0–8.3)

## 2019-02-06 LAB — CBC WITH DIFFERENTIAL/PLATELET
Basophils Absolute: 0.1 10*3/uL (ref 0.0–0.1)
Basophils Relative: 0.8 % (ref 0.0–3.0)
Eosinophils Absolute: 0.1 10*3/uL (ref 0.0–0.7)
Eosinophils Relative: 1.2 % (ref 0.0–5.0)
HCT: 40.4 % (ref 36.0–46.0)
Hemoglobin: 13.4 g/dL (ref 12.0–15.0)
Lymphocytes Relative: 31.3 % (ref 12.0–46.0)
Lymphs Abs: 3.3 10*3/uL (ref 0.7–4.0)
MCHC: 33.1 g/dL (ref 30.0–36.0)
MCV: 88.8 fl (ref 78.0–100.0)
Monocytes Absolute: 0.7 10*3/uL (ref 0.1–1.0)
Monocytes Relative: 6.8 % (ref 3.0–12.0)
Neutro Abs: 6.4 10*3/uL (ref 1.4–7.7)
Neutrophils Relative %: 59.9 % (ref 43.0–77.0)
Platelets: 252 10*3/uL (ref 150.0–400.0)
RBC: 4.55 Mil/uL (ref 3.87–5.11)
RDW: 13.4 % (ref 11.5–15.5)
WBC: 10.6 10*3/uL — ABNORMAL HIGH (ref 4.0–10.5)

## 2019-02-06 LAB — URINE CULTURE
MICRO NUMBER:: 739702
Result:: NO GROWTH
SPECIMEN QUALITY:: ADEQUATE

## 2019-03-03 DIAGNOSIS — N898 Other specified noninflammatory disorders of vagina: Secondary | ICD-10-CM | POA: Diagnosis not present

## 2019-03-03 DIAGNOSIS — Z01419 Encounter for gynecological examination (general) (routine) without abnormal findings: Secondary | ICD-10-CM | POA: Diagnosis not present

## 2019-03-04 MED FILL — TAYTULLA 1 MG-20 MCG CAP: 1-20 | 28 days supply | Qty: 28 | Fill #0

## 2019-04-12 ENCOUNTER — Encounter (HOSPITAL_BASED_OUTPATIENT_CLINIC_OR_DEPARTMENT_OTHER): Payer: Self-pay | Admitting: Emergency Medicine

## 2019-04-12 ENCOUNTER — Other Ambulatory Visit: Payer: Self-pay

## 2019-04-12 ENCOUNTER — Emergency Department (HOSPITAL_BASED_OUTPATIENT_CLINIC_OR_DEPARTMENT_OTHER)
Admission: EM | Admit: 2019-04-12 | Discharge: 2019-04-12 | Disposition: A | Discharge status: Home / Self Care | Payer: 59 | Attending: Emergency Medicine | Admitting: Emergency Medicine

## 2019-04-12 DIAGNOSIS — R3915 Urgency of urination: Secondary | ICD-10-CM | POA: Diagnosis not present

## 2019-04-12 DIAGNOSIS — R350 Frequency of micturition: Secondary | ICD-10-CM | POA: Diagnosis not present

## 2019-04-12 DIAGNOSIS — N3001 Acute cystitis with hematuria: Secondary | ICD-10-CM | POA: Diagnosis not present

## 2019-04-12 DIAGNOSIS — Z79899 Other long term (current) drug therapy: Secondary | ICD-10-CM | POA: Insufficient documentation

## 2019-04-12 DIAGNOSIS — R000 Tachycardia, unspecified: Secondary | ICD-10-CM | POA: Diagnosis not present

## 2019-04-12 DIAGNOSIS — R300 Dysuria: Secondary | ICD-10-CM | POA: Diagnosis not present

## 2019-04-12 DIAGNOSIS — R319 Hematuria, unspecified: Secondary | ICD-10-CM | POA: Diagnosis not present

## 2019-04-12 DIAGNOSIS — I10 Essential (primary) hypertension: Secondary | ICD-10-CM | POA: Diagnosis not present

## 2019-04-12 LAB — CBC WITH DIFFERENTIAL/PLATELET
Abs Immature Granulocytes: 0.05 10*3/uL (ref 0.00–0.07)
Basophils Absolute: 0 10*3/uL (ref 0.0–0.1)
Basophils Relative: 0 %
Eosinophils Absolute: 0 10*3/uL (ref 0.0–0.5)
Eosinophils Relative: 0 %
HCT: 38.1 % (ref 36.0–46.0)
Hemoglobin: 12.5 g/dL (ref 12.0–15.0)
Immature Granulocytes: 0 %
Lymphocytes Relative: 9 %
Lymphs Abs: 1.3 10*3/uL (ref 0.7–4.0)
MCH: 28.8 pg (ref 26.0–34.0)
MCHC: 32.8 g/dL (ref 30.0–36.0)
MCV: 87.8 fL (ref 80.0–100.0)
Monocytes Absolute: 0.7 10*3/uL (ref 0.1–1.0)
Monocytes Relative: 5 %
Neutro Abs: 12.3 10*3/uL — ABNORMAL HIGH (ref 1.7–7.7)
Neutrophils Relative %: 86 %
Platelets: 198 10*3/uL (ref 150–400)
RBC: 4.34 MIL/uL (ref 3.87–5.11)
RDW: 13.2 % (ref 11.5–15.5)
WBC: 14.4 10*3/uL — ABNORMAL HIGH (ref 4.0–10.5)
nRBC: 0 % (ref 0.0–0.2)

## 2019-04-12 LAB — COMPREHENSIVE METABOLIC PANEL
ALT: 12 U/L (ref 0–44)
AST: 15 U/L (ref 15–41)
Albumin: 4 g/dL (ref 3.5–5.0)
Alkaline Phosphatase: 41 U/L (ref 38–126)
Anion gap: 10 (ref 5–15)
BUN: 6 mg/dL (ref 6–20)
CO2: 22 mmol/L (ref 22–32)
Calcium: 8.8 mg/dL — ABNORMAL LOW (ref 8.9–10.3)
Chloride: 106 mmol/L (ref 98–111)
Creatinine, Ser: 0.58 mg/dL (ref 0.44–1.00)
GFR calc Af Amer: >60 mL/min (ref >60)
GFR calc non Af Amer: >60 mL/min (ref >60)
Glucose, Bld: 94 mg/dL (ref 70–99)
Potassium: 3 mmol/L — ABNORMAL LOW (ref 3.5–5.1)
Sodium: 138 mmol/L (ref 135–145)
Total Bilirubin: 0.4 mg/dL (ref 0.3–1.2)
Total Protein: 7.2 g/dL (ref 6.5–8.1)

## 2019-04-12 LAB — URINALYSIS, ROUTINE W REFLEX MICROSCOPIC
Glucose, UA: NEGATIVE mg/dL
Ketones, ur: NEGATIVE mg/dL
Nitrite: POSITIVE — AB
Protein, ur: >300 mg/dL — AB
Specific Gravity, Urine: 1.01 (ref 1.005–1.030)
pH: 7.5 (ref 5.0–8.0)

## 2019-04-12 LAB — URINALYSIS, MICROSCOPIC (REFLEX)
RBC / HPF: >50 RBC/hpf (ref 0–5)
WBC, UA: >50 WBC/hpf (ref 0–5)

## 2019-04-12 LAB — LACTIC ACID, PLASMA: Lactic Acid, Venous: 0.9 mmol/L (ref 0.5–1.9)

## 2019-04-12 LAB — PREGNANCY, URINE: Preg Test, Ur: NEGATIVE

## 2019-04-12 MED ORDER — PROPRANOLOL HCL 40 MG PO TABS
40.0000 mg | ORAL_TABLET | Freq: Once | ORAL | Status: DC
  Filled 2019-04-12: qty 1

## 2019-04-12 MED ORDER — SODIUM CHLORIDE 0.9 % IV SOLN
1.0000 g | Freq: Once | INTRAVENOUS | Status: AC
  Administered 2019-04-12: 12:00:00 1 g via INTRAVENOUS
  Filled 2019-04-12: qty 10

## 2019-04-12 MED ORDER — PHENAZOPYRIDINE HCL 200 MG PO TABS: 200.0000 mg | ORAL_TABLET | Freq: Three times a day (TID) | ORAL | 0 refills | Status: DC

## 2019-04-12 MED ORDER — SODIUM CHLORIDE 0.9 % IV BOLUS
1000.0000 mL | Freq: Once | INTRAVENOUS | Status: AC
  Administered 2019-04-12: 10:00:00 1000 mL via INTRAVENOUS

## 2019-04-12 MED ORDER — SODIUM CHLORIDE 0.9 % IV SOLN
INTRAVENOUS | Status: DC | PRN
  Administered 2019-04-12: 500 mL via INTRAVENOUS

## 2019-04-12 MED ORDER — SODIUM CHLORIDE 0.9 % IV BOLUS
1000.0000 mL | Freq: Once | INTRAVENOUS | Status: AC
  Administered 2019-04-12: 1000 mL via INTRAVENOUS

## 2019-04-12 MED ORDER — POTASSIUM CHLORIDE CRYS ER 20 MEQ PO TBCR
40.0000 meq | EXTENDED_RELEASE_TABLET | Freq: Once | ORAL | Status: AC
  Administered 2019-04-12: 40 meq via ORAL
  Filled 2019-04-12: qty 2

## 2019-04-12 MED ORDER — CEPHALEXIN 500 MG PO CAPS: 500.0000 mg | ORAL_CAPSULE | Freq: Two times a day (BID) | ORAL | 0 refills | Status: AC

## 2019-04-12 MED ORDER — LABETALOL HCL 5 MG/ML IV SOLN: 5.0000 mg | Freq: Once | INTRAVENOUS | Status: DC

## 2019-04-12 NOTE — ED Notes (Signed)
Pt took her own inderal, OK per Dr Billy Fischer

## 2019-04-12 NOTE — ED Triage Notes (Signed)
Dysuria since yesterday, hematuria today. Sent from [facility]

## 2019-04-12 NOTE — ED Notes (Signed)
ED Provider at bedside. 

## 2019-04-12 NOTE — ED Provider Notes (Signed)
[facility] EMERGENCY DEPARTMENT Provider Note   CSN: [id number] Arrival date & time: 04/12/19  [id number]     History   Chief Complaint Chief Complaint  Patient presents with  . Hematuria  . Dysuria    HPI Erin Byrd is a 28 y.o. female.     HPI   28 year old female with a history of essential tremor and generalized anxiety disorder on daily propranolol, presents with concern for dysuria, hematuria, urgency, sent from urgent care.  Reports that yesterday she had some sensation of abnormal urination with foul smelling urine, and symptoms increased this morning with some mild dysuria, urgency, and frequency.  This morning began to have blood in her urine.  Describes passing some clots in addition to dark pink-colored urine, like "cheerwine."  Reports she has not had her dose of propranolol this morning, does report some anxiety after the discussion she has had with urgent care.  She denies abdominal pain, flank pain, fever, nausea, vomiting, vaginal bleeding or discharge, constipation or diarrhea or other concerns.  She does have a history of urinary tract infection once in the past that she associated with intercourse, reports that she believes that might have brought on the current episode.  Mother does have a history of hematuria and has been seen by urology, thought to have possible hereditary type of hematuria.  She is not on blood thinners.  History reviewed. No pertinent past medical history.  Patient Active Problem List   Diagnosis Date Noted  . Microscopic hematuria 11/02/2018  . Generalized anxiety disorder 11/24/2016  . Acute appendicitis s/p lap appendectomy 11/15/2016 11/15/2016  . Essential tremor 04/10/2014  . External hemorrhoid 04/10/2014    Past Surgical History:  Procedure Laterality Date  . APPENDECTOMY    . ARM WOUND REPAIR / CLOSURE  2016   broken arm  . LAPAROSCOPIC APPENDECTOMY N/A 11/15/2016   Procedure: APPENDECTOMY LAPAROSCOPIC;  Surgeon:  Armandina Gemma, MD;  Location: [facility];  Service: General;  Laterality: N/A;  . WISDOM TOOTH EXTRACTION       OB History   No obstetric history on file.      Home Medications    Prior to Admission medications   Medication Sig Start Date End Date Taking? Authorizing Provider  acetaminophen (TYLENOL) 325 MG tablet Take 2 tablets (650 mg total) by mouth every 6 (six) hours as needed for mild pain (or temp > 100). 11/16/16   Earnstine Regal, PA-C  cephALEXin (KEFLEX) 500 MG capsule Take 1 capsule (500 mg total) by mouth 2 (two) times daily for 7 days. 04/12/19 04/19/19  Gareth Morgan, MD  clonazePAM (KLONOPIN) 0.5 MG tablet Take 1 tablet (0.5 mg total) by mouth at bedtime. 10/31/17   Inda Coke, PA  fluticasone (FLONASE) 50 MCG/ACT nasal spray Place 2 sprays into both nostrils daily. 04/19/17   Inda Coke, PA  hydrocortisone (ANUSOL-HC) 2.5 % rectal cream Place 1 application rectally 2 (two) times daily. 07/25/17   Zehr, Laban Emperor, PA-C  hydrocortisone (ANUSOL-HC) 25 MG suppository Place 1 suppository (25 mg total) rectally 2 (two) times daily. 09/18/18   Inda Coke, PA  ibuprofen (ADVIL,MOTRIN) 200 MG tablet You can take 2-3 tablets every 6 hours as needed.  You can alternate with Tylenol as needed or you can use prescribed pain medicine for an alternate pain medicine. 11/16/16   Earnstine Regal, PA-C  norethindrone-ethinyl estradiol (JUNEL FE,GILDESS FE,LOESTRIN FE) 1-20 MG-MCG tablet Take 1 tablet by mouth daily.    [provider]  phenazopyridine (PYRIDIUM)  200 MG tablet Take 1 tablet (200 mg total) by mouth 3 (three) times daily. 04/12/19   Gareth Morgan, MD  propranolol (INDERAL) 20 MG tablet Take 2 tablets in the morning, 1 in the afternoon 08/07/18   Tat, Eustace Quail, DO    Family History Family History  Problem Relation Age of Onset  . Hypertension Mother   . Hypertension Father   . Hypertension Paternal Grandmother   . Hypertension Paternal Grandfather    . Diabetes Paternal Grandfather   . Breast cancer Neg Hx   . Colon cancer Neg Hx     Social History Social History   Tobacco Use  . Smoking status: Never Smoker  . Smokeless tobacco: Never Used  Substance Use Topics  . Alcohol use: Yes    Alcohol/week: 2.0 - 3.0 standard drinks    Types: 2 - 3 Cans of beer per week  . Drug use: No     Allergies   Erythromycin and Penicillins   Review of Systems Review of Systems  Constitutional: Negative for fever.  HENT: Negative for congestion.   Respiratory: Negative for cough and shortness of breath.   Cardiovascular: Negative for chest pain.  Gastrointestinal: Negative for abdominal pain, nausea and vomiting.  Genitourinary: Positive for dysuria, frequency, hematuria and urgency. Negative for flank pain.  Skin: Negative for rash.  Neurological: Negative for headaches.  Psychiatric/Behavioral: The patient is nervous/anxious.      Physical Exam Updated Vital Signs BP (!) 143/97   Pulse (!) 128   Temp 98.4 F (36.9 C) (Oral)   Resp (!) 23   Ht 5\' 7"  (1.702 m)   Wt 70.3 kg   LMP 03/18/2019   SpO2 100%   BMI 24.28 kg/m   Physical Exam Vitals signs and nursing note reviewed.  Constitutional:      General: She is not in acute distress.    Appearance: She is well-developed. She is not diaphoretic.  HENT:     Head: Normocephalic and atraumatic.  Eyes:     Conjunctiva/sclera: Conjunctivae normal.  Neck:     Musculoskeletal: Normal range of motion.  Cardiovascular:     Rate and Rhythm: Regular rhythm. Tachycardia present.  Pulmonary:     Effort: Pulmonary effort is normal. No respiratory distress.  Abdominal:     General: There is no distension.     Palpations: Abdomen is soft.     Tenderness: There is no abdominal tenderness. There is no right CVA tenderness, left CVA tenderness or guarding.  Musculoskeletal:        General: No tenderness.  Skin:    General: Skin is warm and dry.     Findings: No erythema or rash.   Neurological:     Mental Status: She is alert and oriented to person, place, and time.      ED Treatments / Results  Labs (all labs ordered are listed, but only abnormal results are displayed) Labs Reviewed  URINALYSIS, ROUTINE W REFLEX MICROSCOPIC - Abnormal; Notable for the following components:      Result Value   Color, Urine RED (*)    APPearance HAZY (*)    Hgb urine dipstick LARGE (*)    Bilirubin Urine SMALL (*)    Protein, ur >300 (*)    Nitrite POSITIVE (*)    Leukocytes,Ua MODERATE (*)    All other components within normal limits  URINALYSIS, MICROSCOPIC (REFLEX) - Abnormal; Notable for the following components:   Bacteria, UA MANY (*)    All  other components within normal limits  URINE CULTURE  PREGNANCY, URINE  CBC WITH DIFFERENTIAL/PLATELET  COMPREHENSIVE METABOLIC PANEL    EKG None  Radiology No results found.  Procedures Procedures (including critical care time)  Medications Ordered in ED Medications  propranolol (INDERAL) tablet 40 mg (has no administration in time range)  sodium chloride 0.9 % bolus 1,000 mL (1,000 mLs Intravenous New Bag/Given 04/12/19 1004)     Initial Impression / Assessment and Plan / ED Course  I have reviewed the triage vital signs and the nursing notes.  Pertinent labs & imaging results that were available during my care of the patient were reviewed by me and considered in my medical decision making (see chart for details).        28 year old female with a history of essential tremor and generalized anxiety disorder on daily propranolol, presents with concern for dysuria, hematuria, urgency, sent from urgent care.  She has tachycardic to 138 on arrival to the emergency department, with sinus tachycardia noted on telemetry.  Given tachycardia with symptoms, will obtain lab work for further evaluation.  Urinalysis is consistent with urinary tract infection, and is consistent with symptoms of infectious hemorrhagic  cystitis.  She does not have flank pain, fever, nausea or vomiting.  Tachycardia may be secondary to infection, but other contributing factors include patient not having her regular propranolol dosing as well as anxiety.  Given IV fluids. Labwork shows WBC elevated without other acute abnormalities.  Given additional IV fluids, Rocephin with concern for possible sepsis, although anxiety and missing home beta blocker may be contributor.   Lactate sent which was normal. Pt took home propranolol and heart rate improved. Pt stable for outpatient treatment.   Given rx for keflex, pyridium, recommend follow up with PCP in one month for recheck UA, and consider urology follow up if continued hematuria.    Final Clinical Impressions(s) / ED Diagnoses   Final diagnoses:  Acute cystitis with hematuria    ED Discharge Orders         Ordered    cephALEXin (KEFLEX) 500 MG capsule  2 times daily     04/12/19 1007    phenazopyridine (PYRIDIUM) 200 MG tablet  3 times daily     04/12/19 1008           Gareth Morgan, MD 04/12/19 2108

## 2019-04-12 NOTE — ED Notes (Signed)
Pt verbalized understanding of dc instructions.

## 2019-04-12 NOTE — ED Notes (Signed)
Blood cultures drawn, at bedside

## 2019-04-13 LAB — URINE CULTURE: Culture: <10000 — AB

## 2019-04-21 ENCOUNTER — Other Ambulatory Visit: Payer: Self-pay

## 2019-04-21 ENCOUNTER — Encounter: Payer: Self-pay | Admitting: Physician Assistant

## 2019-04-21 ENCOUNTER — Ambulatory Visit (INDEPENDENT_AMBULATORY_CARE_PROVIDER_SITE_OTHER): Payer: 59 | Admitting: Physician Assistant

## 2019-04-21 VITALS — BP 130/90 | HR 84 | Temp 98.6°F | Ht 67.0 in | Wt 154.0 lb

## 2019-04-21 DIAGNOSIS — R82998 Other abnormal findings in urine: Secondary | ICD-10-CM | POA: Diagnosis not present

## 2019-04-21 DIAGNOSIS — Z114 Encounter for screening for human immunodeficiency virus [HIV]: Secondary | ICD-10-CM | POA: Diagnosis not present

## 2019-04-21 DIAGNOSIS — Z113 Encounter for screening for infections with a predominantly sexual mode of transmission: Secondary | ICD-10-CM | POA: Diagnosis not present

## 2019-04-21 DIAGNOSIS — R310 Gross hematuria: Secondary | ICD-10-CM | POA: Diagnosis not present

## 2019-04-21 DIAGNOSIS — R350 Frequency of micturition: Secondary | ICD-10-CM | POA: Diagnosis not present

## 2019-04-21 DIAGNOSIS — F411 Generalized anxiety disorder: Secondary | ICD-10-CM

## 2019-04-21 DIAGNOSIS — R300 Dysuria: Secondary | ICD-10-CM | POA: Diagnosis not present

## 2019-04-21 LAB — URINALYSIS, ROUTINE W REFLEX MICROSCOPIC
Bilirubin Urine: NEGATIVE
Nitrite: NEGATIVE
Specific Gravity, Urine: <=1.005 — AB (ref 1.000–1.030)
Total Protein, Urine: NEGATIVE
Urine Glucose: NEGATIVE
Urobilinogen, UA: 0.2 (ref 0.0–1.0)
pH: 7.5 (ref 5.0–8.0)

## 2019-04-21 LAB — POCT URINALYSIS DIPSTICK
Bilirubin, UA: NEGATIVE
Blood, UA: 2
Glucose, UA: NEGATIVE
Ketones, UA: NEGATIVE
Leukocytes, UA: NEGATIVE
Nitrite, UA: NEGATIVE
Protein, UA: NEGATIVE
Spec Grav, UA: 1.01 (ref 1.010–1.025)
Urobilinogen, UA: 0.2 E.U./dL
pH, UA: 7.5 (ref 5.0–8.0)

## 2019-04-21 NOTE — Progress Notes (Signed)
Erin Byrd is a 28 y.o. female is here to follow up on UTI.  I acted as a Education administrator for [employer], PA-C Anselmo Pickler, LPN  History of Present Illness:   Chief Complaint  Patient presents with  . Urinary Tract Infection    HPI   UTI Pt following up today from UTI on 10/10. Pt completed antibiotic Keflex 500 mg BID x 7 days. Pt started with symptoms again in the middle of the night. c/o low pelvic burning, frequency and some urgency, she went at least 5-6 times during the night. Also took one Pyridium which helped symptoms. Denies fever, chills, backache, and no hematuria.   She did reach out to her gynecologist because of the symptoms, and they did check a urine as well as did some STD testing.  Patient has been reviewing possible diagnoses on [employer] and has her very stressed out.  She is fearful that she has bladder cancer or some other significant health issue.  She denies any vaginal pain, vaginal discharge.  She did drink 1 beer last night and states that maybe she did not hydrate enough as well.  She does report all longstanding history of having intermittent dysuria that will be relieved with hydration.  She has never had a diagnosis of interstitial cystitis.  She is a non-smoker.   Health Maintenance Due  Topic Date Due  . PAP-Cervical Cytology Screening  04/24/2012  . PAP SMEAR-Modifier  01/31/2018  . INFLUENZA VACCINE  02/01/2019    Past Medical History:  Diagnosis Date  . Anxiety      Social History   Socioeconomic History  . Marital status: Single    Spouse name: Not on file  . Number of children: 0  . Years of education: 59  . Highest education level: Not on file  Occupational History  . Occupation: Quarry manager  Social Needs  . Financial resource strain: Not on file  . Food insecurity    Worry: Not on file    Inability: Not on file  . Transportation needs    Medical: Not on file    Non-medical: Not on file  Tobacco Use  . Smoking status:  Never Smoker  . Smokeless tobacco: Never Used  Substance and Sexual Activity  . Alcohol use: Yes    Alcohol/week: 2.0 - 3.0 standard drinks    Types: 2 - 3 Cans of beer per week  . Drug use: No  . Sexual activity: Yes    Birth control/protection: Pill  Lifestyle  . Physical activity    Days per week: Not on file    Minutes per session: Not on file  . Stress: Not on file  Relationships  . Social Herbalist on phone: Not on file    Gets together: Not on file    Attends religious service: Not on file    Active member of club or organization: Not on file    Attends meetings of clubs or organizations: Not on file    Relationship status: Not on file  . Intimate partner violence    Fear of current or ex partner: Not on file    Emotionally abused: Not on file    Physically abused: Not on file    Forced sexual activity: Not on file  Other Topics Concern  . Not on file  Social History Narrative   Fun: Relax at home, go out with friends, shop    Past Surgical History:  Procedure Laterality Date  .  APPENDECTOMY    . ARM WOUND REPAIR / CLOSURE  2016   broken arm  . LAPAROSCOPIC APPENDECTOMY N/A 11/15/2016   Procedure: APPENDECTOMY LAPAROSCOPIC;  Surgeon: Armandina Gemma, MD;  Location: [facility];  Service: General;  Laterality: N/A;  . WISDOM TOOTH EXTRACTION      Family History  Problem Relation Age of Onset  . Hypertension Mother   . Hypertension Father   . Hypertension Paternal Grandmother   . Hypertension Paternal Grandfather   . Diabetes Paternal Grandfather   . Breast cancer Neg Hx   . Colon cancer Neg Hx     PMHx, SurgHx, SocialHx, FamHx, Medications, and Allergies were reviewed in the Visit Navigator and updated as appropriate.   Patient Active Problem List   Diagnosis Date Noted  . Microscopic hematuria 11/02/2018  . Generalized anxiety disorder 11/24/2016  . Acute appendicitis s/p lap appendectomy 11/15/2016 11/15/2016  . Essential tremor 04/10/2014  .  External hemorrhoid 04/10/2014    Social History   Tobacco Use  . Smoking status: Never Smoker  . Smokeless tobacco: Never Used  Substance Use Topics  . Alcohol use: Yes    Alcohol/week: 2.0 - 3.0 standard drinks    Types: 2 - 3 Cans of beer per week  . Drug use: No    Current Medications and Allergies:    Current Outpatient Medications:  .  acetaminophen (TYLENOL) 325 MG tablet, Take 2 tablets (650 mg total) by mouth every 6 (six) hours as needed for mild pain (or temp > 100)., Disp: , Rfl:  .  clonazePAM (KLONOPIN) 0.5 MG tablet, Take 1 tablet (0.5 mg total) by mouth at bedtime., Disp: 30 tablet, Rfl: 0 .  fluticasone (FLONASE) 50 MCG/ACT nasal spray, Place 2 sprays into both nostrils daily., Disp: 16 g, Rfl: 2 .  hydrocortisone (ANUSOL-HC) 2.5 % rectal cream, Place 1 application rectally 2 (two) times daily., Disp: 30 g, Rfl: 1 .  hydrocortisone (ANUSOL-HC) 25 MG suppository, Place 1 suppository (25 mg total) rectally 2 (two) times daily., Disp: 12 suppository, Rfl: 3 .  ibuprofen (ADVIL,MOTRIN) 200 MG tablet, You can take 2-3 tablets every 6 hours as needed.  You can alternate with Tylenol as needed or you can use prescribed pain medicine for an alternate pain medicine., Disp: , Rfl:  .  Norethin Ace-Eth Estrad-FE (TAYTULLA) 1-20 MG-MCG(24) CAPS, Take by mouth., Disp: , Rfl:  .  phenazopyridine (PYRIDIUM) 200 MG tablet, Take 1 tablet (200 mg total) by mouth 3 (three) times daily., Disp: 6 tablet, Rfl: 0 .  propranolol (INDERAL) 20 MG tablet, Take 2 tablets in the morning, 1 in the afternoon, Disp: 270 tablet, Rfl: 1   Allergies  Allergen Reactions  . Erythromycin Nausea And Vomiting and Other (See Comments)    Reaction:  Abdominal pain  . Penicillins Rash and Other (See Comments)    Has patient had a PCN reaction causing immediate rash, facial/tongue/throat swelling, SOB or lightheadedness with hypotension: No Has patient had a PCN reaction causing severe rash involving mucus  membranes or skin necrosis: No Has patient had a PCN reaction that required hospitalization No Has patient had a PCN reaction occurring within the last 10 years: No If all of the above answers are "NO", then may proceed with Cephalosporin use.    Review of Systems   ROS  Negative unless otherwise specified per HPI.  Vitals:   Vitals:   04/21/19 1303  BP: 130/90  Pulse: 84  Temp: 98.6 F (37 C)  TempSrc: Temporal  SpO2: 97%  Weight: 154 lb (69.9 kg)  Height: 5\' 7"  (1.702 m)     Body mass index is 24.12 kg/m.   Physical Exam:    Physical Exam Vitals signs and nursing note reviewed.  Constitutional:      General: She is not in acute distress.    Appearance: Normal appearance. She is well-developed. She is not ill-appearing or toxic-appearing.  Cardiovascular:     Rate and Rhythm: Normal rate and regular rhythm.     Pulses: Normal pulses.     Heart sounds: Normal heart sounds, S1 normal and S2 normal.     Comments: No LE edema Pulmonary:     Effort: Pulmonary effort is normal.     Breath sounds: Normal breath sounds.  Abdominal:     General: Bowel sounds are normal.     Tenderness: There is no abdominal tenderness.  Skin:    General: Skin is warm and dry.  Neurological:     Mental Status: She is alert.     GCS: GCS eye subscore is 4. GCS verbal subscore is 5. GCS motor subscore is 6.  Psychiatric:        Speech: Speech normal.        Behavior: Behavior normal. Behavior is cooperative.      Assessment and Plan:    Samyah was seen today for urinary tract infection.  Diagnoses and all orders for this visit:  Gross hematuria; Dysuria Urine today negative, but will send out for culture.  Worsening precautions advised.  She and I discussed referral to urology for further work-up, and to give her reassurance.  I have put referral in.  Encourage adequate hydration and limiting acidic foods and juices at this time. -     Urine Culture -     POCT urinalysis  dipstick -     Urinalysis, Routine w reflex microscopic -     Ambulatory referral to Urology  Anxiety Uncontrolled.  Will send to Trey Paula, our in office therapist for help with strategies for health anxiety and catastrophic thinking.  Continue propranolol.   . Reviewed expectations re: course of current medical issues. . Discussed self-management of symptoms. . Outlined signs and symptoms indicating need for more acute intervention. . Patient verbalized understanding and all questions were answered. . See orders for this visit as documented in the electronic medical record. . Patient received an After Visit Summary.  CMA or LPN served as scribe during this visit. History, Physical, and Plan performed by medical provider. The above documentation has been reviewed and is accurate and complete.   Inda Coke, PA-C [place], [place] 04/21/2019  Follow-up: No follow-ups on file.

## 2019-04-21 NOTE — Patient Instructions (Signed)
It was great to see you!  You will be contacted about your referral to urology.   Keep me posted if anything comes up.  It will be ok!  Take care,  Inda Coke PA-C

## 2019-04-22 ENCOUNTER — Ambulatory Visit (INDEPENDENT_AMBULATORY_CARE_PROVIDER_SITE_OTHER): Payer: 59 | Admitting: Physician Assistant

## 2019-04-22 ENCOUNTER — Ambulatory Visit (HOSPITAL_BASED_OUTPATIENT_CLINIC_OR_DEPARTMENT_OTHER)
Admission: RE | Admit: 2019-04-22 | Discharge: 2019-04-22 | Disposition: A | Discharge status: Home / Self Care | Payer: 59 | Source: Ambulatory Visit | Attending: Physician Assistant | Admitting: Physician Assistant

## 2019-04-22 ENCOUNTER — Encounter: Payer: Self-pay | Admitting: Physician Assistant

## 2019-04-22 VITALS — BP 150/96 | HR 99 | Temp 98.3°F | Ht 67.0 in | Wt 154.0 lb

## 2019-04-22 DIAGNOSIS — R109 Unspecified abdominal pain: Secondary | ICD-10-CM | POA: Diagnosis not present

## 2019-04-22 DIAGNOSIS — R310 Gross hematuria: Secondary | ICD-10-CM | POA: Diagnosis not present

## 2019-04-22 DIAGNOSIS — R1030 Lower abdominal pain, unspecified: Secondary | ICD-10-CM | POA: Insufficient documentation

## 2019-04-22 DIAGNOSIS — N390 Urinary tract infection, site not specified: Secondary | ICD-10-CM | POA: Diagnosis not present

## 2019-04-22 LAB — POCT URINE PREGNANCY: Preg Test, Ur: NEGATIVE

## 2019-04-22 MED ORDER — TAMSULOSIN HCL 0.4 MG PO CAPS: 0.4000 mg | ORAL_CAPSULE | Freq: Every day | ORAL | 0 refills | Status: DC

## 2019-04-22 MED ORDER — CIPROFLOXACIN HCL 500 MG PO TABS: 500.0000 mg | ORAL_TABLET | Freq: Two times a day (BID) | ORAL | 0 refills | Status: AC

## 2019-04-22 MED FILL — TAMSULOSIN HCL 0.4 MG CAP: 0.4 | 30 days supply | Qty: 30 | Fill #0

## 2019-04-22 MED FILL — CIPROFLOXACIN HCL 500 MG TA: 500 | 5 days supply | Qty: 10 | Fill #0

## 2019-04-22 NOTE — Progress Notes (Signed)
Brief Note  Patient was seen by me yesterday in the office for hematuria.  UA and urine culture were performed.  She contacted our office this morning because she had gross hematuria and blood clots this morning when passing urine.  She was instructed to come to the office for labs which we have performed this morning, CBC and CMP and also have checked her vitals and urine pregnancy test.   Vitals:   04/22/19 0815  BP: (!) 150/96  Pulse: 99  Temp: 98.3 F (36.8 C)  SpO2: 97%   We were able to schedule her for CT stone protocol abdomen [facility] this morning.  She is on her way at this time.  Worsening precautions advised.  Results for orders placed or performed in visit on 04/22/19  POCT urine pregnancy  Result Value Ref Range   Preg Test, Ur Negative Negative    We are going to go ahead and start treatment for complicated UTI, for possible infection from stone with Cipro and Flomax.  Inda Coke PA-C 04/22/19

## 2019-04-22 NOTE — Addendum Note (Signed)
Addended by: Francis Dowse T on: 04/22/2019 10:22 AM   Modules accepted: Orders

## 2019-04-23 LAB — URINE CULTURE
MICRO NUMBER:: 1004157
Result:: NO GROWTH
SPECIMEN QUALITY:: ADEQUATE

## 2019-05-01 MED FILL — TAYTULLA 1 MG-20 MCG CAP: 1-20 | 28 days supply | Qty: 28 | Fill #1

## 2019-05-02 DIAGNOSIS — N3000 Acute cystitis without hematuria: Secondary | ICD-10-CM | POA: Diagnosis not present

## 2019-05-02 DIAGNOSIS — R310 Gross hematuria: Secondary | ICD-10-CM | POA: Diagnosis not present

## 2019-05-05 ENCOUNTER — Ambulatory Visit: Payer: 59 | Admitting: Physician Assistant

## 2019-05-13 DIAGNOSIS — R310 Gross hematuria: Secondary | ICD-10-CM | POA: Diagnosis not present

## 2019-05-13 DIAGNOSIS — N3000 Acute cystitis without hematuria: Secondary | ICD-10-CM | POA: Diagnosis not present

## 2019-05-23 ENCOUNTER — Encounter: Payer: Self-pay | Admitting: Physician Assistant

## 2019-05-23 MED ORDER — PROPRANOLOL HCL 20 MG PO TABS: ORAL_TABLET | ORAL | 1 refills | Status: DC

## 2019-05-23 MED FILL — PROPRANOLOL 20 MG TABLET: 20 | 90 days supply | Qty: 270 | Fill #0

## 2019-05-27 MED FILL — TAYTULLA 1 MG-20 MCG CAP: 1-20 | 28 days supply | Qty: 28 | Fill #2

## 2019-06-24 MED FILL — TAYTULLA 1 MG-20 MCG CAP: 1-20 | 28 days supply | Qty: 28 | Fill #3

## 2019-07-21 MED FILL — TAYTULLA 1 MG-20 MCG CAP: 1-20 | 28 days supply | Qty: 28 | Fill #4

## 2019-08-12 MED FILL — TAYTULLA 1 MG-20 MCG CAP: 1-20 | 28 days supply | Qty: 28 | Fill #5

## 2019-08-26 MED FILL — VALACYCLOVIR HCL 500 MG TAB: 500 | 30 days supply | Qty: 60 | Fill #0

## 2019-09-11 MED FILL — TAYTULLA 1 MG-20 MCG CAP: 1-20 | 28 days supply | Qty: 28 | Fill #6

## 2019-09-22 ENCOUNTER — Other Ambulatory Visit: Payer: Self-pay

## 2019-09-22 ENCOUNTER — Ambulatory Visit (INDEPENDENT_AMBULATORY_CARE_PROVIDER_SITE_OTHER): Payer: 59 | Admitting: Physician Assistant

## 2019-09-22 ENCOUNTER — Encounter: Payer: Self-pay | Admitting: Physician Assistant

## 2019-09-22 VITALS — Temp 98.1°F

## 2019-09-22 DIAGNOSIS — R05 Cough: Secondary | ICD-10-CM | POA: Diagnosis not present

## 2019-09-22 DIAGNOSIS — R059 Cough, unspecified: Secondary | ICD-10-CM

## 2019-09-22 MED ORDER — FLUTICASONE PROPIONATE 50 MCG/ACT NA SUSP: 2.0000 | Freq: Every day | NASAL | 2 refills | Status: AC

## 2019-09-22 MED FILL — FLUTICASONE PROP 50 MCG SPR: 50 | 30 days supply | Qty: 16 | Fill #0

## 2019-09-22 NOTE — Progress Notes (Signed)
Virtual Visit via Video   I connected with Erin Byrd on 09/22/19 at  3:30 PM EDT by a video enabled telemedicine application and verified that I am speaking with the correct person using two identifiers. Location patient: Home Location provider: [facility], Office Persons participating in the virtual visit: Jorene Lahrman, Inda Coke PA-C, Anselmo Pickler, LPN   I discussed the limitations of evaluation and management by telemedicine and the availability of in person appointments. The patient expressed understanding and agreed to proceed.  I acted as a Education administrator for [employer], PA-C [employer], LPN  Subjective:   HPI:   Cough Pt c/o dry non-productive cough since Saturday. Nasal congestion, dark green drainage in the morning and then clear rest of day. Scratchy throat. Pt is using saline nasal spray, Dayquil. Denies fever or chills, malaise. Pt had her 1st COVID vaccine 09/12/2019.  Has white streaks on her tonsils, tender lymph nodes on both side. White streaks essentially resolved over night.  Has been helping her mom pack up her house to move, feels like she may have been sensitive to dust. Has hx of seasonal allergies.  ROS: See pertinent positives and negatives per HPI.  Patient Active Problem List   Diagnosis Date Noted  . Microscopic hematuria 11/02/2018  . Generalized anxiety disorder 11/24/2016  . Acute appendicitis s/p lap appendectomy 11/15/2016 11/15/2016  . Essential tremor 04/10/2014  . External hemorrhoid 04/10/2014    Social History   Tobacco Use  . Smoking status: Never Smoker  . Smokeless tobacco: Never Used  Substance Use Topics  . Alcohol use: Yes    Alcohol/week: 2.0 - 3.0 standard drinks    Types: 2 - 3 Cans of beer per week    Current Outpatient Medications:  .  acetaminophen (TYLENOL) 325 MG tablet, Take 2 tablets (650 mg total) by mouth every 6 (six) hours as needed for mild pain (or temp > 100)., Disp: , Rfl:  .  clonazePAM  (KLONOPIN) 0.5 MG tablet, Take 1 tablet (0.5 mg total) by mouth at bedtime., Disp: 30 tablet, Rfl: 0 .  fluticasone (FLONASE) 50 MCG/ACT nasal spray, Place 2 sprays into both nostrils daily., Disp: 16 g, Rfl: 2 .  hydrocortisone (ANUSOL-HC) 2.5 % rectal cream, Place 1 application rectally 2 (two) times daily., Disp: 30 g, Rfl: 1 .  hydrocortisone (ANUSOL-HC) 25 MG suppository, Place 1 suppository (25 mg total) rectally 2 (two) times daily., Disp: 12 suppository, Rfl: 3 .  ibuprofen (ADVIL,MOTRIN) 200 MG tablet, You can take 2-3 tablets every 6 hours as needed.  You can alternate with Tylenol as needed or you can use prescribed pain medicine for an alternate pain medicine., Disp: , Rfl:  .  Norethin Ace-Eth Estrad-FE (TAYTULLA) 1-20 MG-MCG(24) CAPS, Take by mouth., Disp: , Rfl:  .  propranolol (INDERAL) 20 MG tablet, Take 2 tablets in the morning, 1 in the afternoon, Disp: 270 tablet, Rfl: 1  Allergies  Allergen Reactions  . Erythromycin Nausea And Vomiting and Other (See Comments)    Reaction:  Abdominal pain  . Penicillins Rash and Other (See Comments)    Has patient had a PCN reaction causing immediate rash, facial/tongue/throat swelling, SOB or lightheadedness with hypotension: No Has patient had a PCN reaction causing severe rash involving mucus membranes or skin necrosis: No Has patient had a PCN reaction that required hospitalization No Has patient had a PCN reaction occurring within the last 10 years: No If all of the above answers are "NO", then may proceed  with Cephalosporin use.    Objective:   VITALS: Per patient if applicable, see vitals. GENERAL: Alert, appears well and in no acute distress. HEENT: Atraumatic, conjunctiva clear, no obvious abnormalities on inspection of external nose and ears. NECK: Normal movements of the head and neck. CARDIOPULMONARY: No increased WOB. Speaking in clear sentences. I:E ratio WNL.  MS: Moves all visible extremities without noticeable  abnormality. PSYCH: Pleasant and cooperative, well-groomed. Speech normal rate and rhythm. Affect is appropriate. Insight and judgement are appropriate. Attention is focused, linear, and appropriate.  NEURO: CN grossly intact. Oriented as arrived to appointment on time with no prompting. Moves both UE equally.  SKIN: No obvious lesions, wounds, erythema, or cyanosis noted on face or hands.  Assessment and Plan:   Erin Byrd was seen today for cough.  Diagnoses and all orders for this visit:  Cough  Other orders -     fluticasone (FLONASE) 50 MCG/ACT nasal spray; Place 2 sprays into both nostrils daily.   Suspect viral etiology, with allergy component. Did recommend that she call [facility] in [place] testing is needed through employer. Recommended Mucinex DM and Flonase. If no improvement by end of week will send in rx for antibiotic. Patient is in no acute distress, no red flags on discussion.  . Reviewed expectations re: course of current medical issues. . Discussed self-management of symptoms. . Outlined signs and symptoms indicating need for more acute intervention. . Patient verbalized understanding and all questions were answered. Marland Kitchen Health Maintenance issues including appropriate healthy diet, exercise, and smoking avoidance were discussed with patient. . See orders for this visit as documented in the electronic medical record.  I discussed the assessment and treatment plan with the patient. The patient was provided an opportunity to ask questions and all were answered. The patient agreed with the plan and demonstrated an understanding of the instructions.   The patient was advised to call back or seek an in-person evaluation if the symptoms worsen or if the condition fails to improve as anticipated.   CMA or LPN served as scribe during this visit. History, Physical, and Plan performed by medical provider. The above documentation has been reviewed and is accurate and  complete.   [place], [place] 09/22/2019

## 2019-09-30 ENCOUNTER — Other Ambulatory Visit: Payer: Self-pay | Admitting: Physician Assistant

## 2019-09-30 MED ORDER — DOXYCYCLINE HYCLATE 100 MG PO TABS: 100.0000 mg | ORAL_TABLET | Freq: Two times a day (BID) | ORAL | 0 refills | Status: DC

## 2019-09-30 MED FILL — DOXYCYCLINE HYCLATE 100 MG: 100 | 10 days supply | Qty: 20 | Fill #0

## 2019-10-08 MED FILL — TAYTULLA 1 MG-20 MCG CAP: 1-20 | 28 days supply | Qty: 28 | Fill #7

## 2019-11-04 MED FILL — TAYTULLA 1 MG-20 MCG CAP: 1-20 | 28 days supply | Qty: 28 | Fill #8

## 2019-11-13 DIAGNOSIS — D229 Melanocytic nevi, unspecified: Secondary | ICD-10-CM | POA: Diagnosis not present

## 2019-11-13 DIAGNOSIS — D1801 Hemangioma of skin and subcutaneous tissue: Secondary | ICD-10-CM | POA: Diagnosis not present

## 2019-11-13 DIAGNOSIS — L814 Other melanin hyperpigmentation: Secondary | ICD-10-CM | POA: Diagnosis not present

## 2019-11-13 DIAGNOSIS — D2272 Melanocytic nevi of left lower limb, including hip: Secondary | ICD-10-CM | POA: Diagnosis not present

## 2019-11-13 DIAGNOSIS — X32XXXS Exposure to sunlight, sequela: Secondary | ICD-10-CM | POA: Diagnosis not present

## 2019-11-13 DIAGNOSIS — L564 Polymorphous light eruption: Secondary | ICD-10-CM | POA: Diagnosis not present

## 2019-11-13 DIAGNOSIS — D2271 Melanocytic nevi of right lower limb, including hip: Secondary | ICD-10-CM | POA: Diagnosis not present

## 2019-11-13 DIAGNOSIS — D235 Other benign neoplasm of skin of trunk: Secondary | ICD-10-CM | POA: Diagnosis not present

## 2019-11-27 DIAGNOSIS — H5213 Myopia, bilateral: Secondary | ICD-10-CM | POA: Diagnosis not present

## 2019-11-27 DIAGNOSIS — H52203 Unspecified astigmatism, bilateral: Secondary | ICD-10-CM | POA: Diagnosis not present

## 2019-12-02 MED FILL — TAYTULLA 1 MG-20 MCG CAP: 1-20 | 28 days supply | Qty: 28 | Fill #9

## 2019-12-17 ENCOUNTER — Encounter: Payer: 59 | Admitting: Physician Assistant

## 2019-12-19 MED FILL — PROPRANOLOL 20 MG TABLET: 20 | 90 days supply | Qty: 270 | Fill #1

## 2019-12-24 MED FILL — TAYTULLA 1 MG-20 MCG CAP: 1-20 | 28 days supply | Qty: 28 | Fill #10

## 2020-01-22 MED FILL — TAYTULLA 1 MG-20 MCG CAP: 1-20 | 28 days supply | Qty: 28 | Fill #11

## 2020-02-09 ENCOUNTER — Encounter: Payer: 59 | Admitting: Physician Assistant

## 2020-02-13 MED FILL — TAYTULLA 1 MG-20 MCG CAP: 1-20 | 28 days supply | Qty: 28 | Fill #0

## 2020-02-20 ENCOUNTER — Ambulatory Visit (INDEPENDENT_AMBULATORY_CARE_PROVIDER_SITE_OTHER): Payer: 59 | Admitting: Physician Assistant

## 2020-02-20 ENCOUNTER — Encounter: Payer: Self-pay | Admitting: Physician Assistant

## 2020-02-20 ENCOUNTER — Other Ambulatory Visit: Payer: Self-pay

## 2020-02-20 VITALS — BP 130/84 | HR 65 | Temp 97.9°F | Ht 67.0 in | Wt 163.4 lb

## 2020-02-20 DIAGNOSIS — M25561 Pain in right knee: Secondary | ICD-10-CM

## 2020-02-20 DIAGNOSIS — Z13220 Encounter for screening for lipoid disorders: Secondary | ICD-10-CM

## 2020-02-20 DIAGNOSIS — F411 Generalized anxiety disorder: Secondary | ICD-10-CM

## 2020-02-20 DIAGNOSIS — G8929 Other chronic pain: Secondary | ICD-10-CM

## 2020-02-20 DIAGNOSIS — Z0000 Encounter for general adult medical examination without abnormal findings: Secondary | ICD-10-CM | POA: Diagnosis not present

## 2020-02-20 DIAGNOSIS — G250 Essential tremor: Secondary | ICD-10-CM | POA: Diagnosis not present

## 2020-02-20 DIAGNOSIS — Z136 Encounter for screening for cardiovascular disorders: Secondary | ICD-10-CM

## 2020-02-20 LAB — CBC WITH DIFFERENTIAL/PLATELET
Absolute Monocytes: 548 cells/uL (ref 200–950)
Basophils Absolute: 52 cells/uL (ref 0–200)
Basophils Relative: 0.6 %
Eosinophils Absolute: 157 cells/uL (ref 15–500)
Eosinophils Relative: 1.8 %
HCT: 43 % (ref 35.0–45.0)
Hemoglobin: 13.8 g/dL (ref 11.7–15.5)
Lymphs Abs: 3497 cells/uL (ref 850–3900)
MCH: 28.9 pg (ref 27.0–33.0)
MCHC: 32.1 g/dL (ref 32.0–36.0)
MCV: 90.1 fL (ref 80.0–100.0)
MPV: 10.9 fL (ref 7.5–12.5)
Monocytes Relative: 6.3 %
Neutro Abs: 4446 cells/uL (ref 1500–7800)
Neutrophils Relative %: 51.1 %
Platelets: 267 10*3/uL (ref 140–400)
RBC: 4.77 10*6/uL (ref 3.80–5.10)
RDW: 13 % (ref 11.0–15.0)
Total Lymphocyte: 40.2 %
WBC: 8.7 10*3/uL (ref 3.8–10.8)

## 2020-02-20 LAB — COMPREHENSIVE METABOLIC PANEL
AG Ratio: 1.5 (calc) (ref 1.0–2.5)
ALT: 12 U/L (ref 6–29)
AST: 17 U/L (ref 10–30)
Albumin: 4.6 g/dL (ref 3.6–5.1)
Alkaline phosphatase (APISO): 49 U/L (ref 31–125)
BUN: 9 mg/dL (ref 7–25)
CO2: 26 mmol/L (ref 20–32)
Calcium: 9.8 mg/dL (ref 8.6–10.2)
Chloride: 102 mmol/L (ref 98–110)
Creat: 0.79 mg/dL (ref 0.50–1.10)
Globulin: 3.1 g/dL (calc) (ref 1.9–3.7)
Glucose, Bld: 84 mg/dL (ref 65–99)
Potassium: 4.3 mmol/L (ref 3.5–5.3)
Sodium: 137 mmol/L (ref 135–146)
Total Bilirubin: 0.6 mg/dL (ref 0.2–1.2)
Total Protein: 7.7 g/dL (ref 6.1–8.1)

## 2020-02-20 LAB — LIPID PANEL
Cholesterol: 175 mg/dL (ref <200)
HDL: 80 mg/dL (ref >=50)
LDL Cholesterol (Calc): 79 mg/dL (calc)
Non-HDL Cholesterol (Calc): 95 mg/dL (calc) (ref <130)
Total CHOL/HDL Ratio: 2.2 (calc) (ref <5.0)
Triglycerides: 78 mg/dL (ref <150)

## 2020-02-20 MED ORDER — CLONAZEPAM 0.5 MG PO TABS: 0.5000 mg | ORAL_TABLET | Freq: Every day | ORAL | 0 refills | Status: AC

## 2020-02-20 MED FILL — clonazePAM 0.5 MG TABS: 0.5 | 30 days supply | Qty: 30 | Fill #0

## 2020-02-20 NOTE — Progress Notes (Signed)
I acted as a Education administrator for [employer], PA-C Anselmo Pickler, LPN   Subjective:    Erin Byrd is a 29 y.o. female and is here for a comprehensive physical exam.  HPI  Health Maintenance Due  Topic Date Due  . Hepatitis C Screening  Never done  . PAP-Cervical Cytology Screening  Never done  . PAP SMEAR-Modifier  01/31/2018  . INFLUENZA VACCINE  02/01/2020    Acute Concerns: R knee pain -- x 1 month. Getting swelling and generalized pain in R knee. Has iced and used anti-inflammatories prn. Keeping leg elevated. Overall symptoms are improving however she has been doing HIIT workouts and wants to be proactive with her symptoms.  Chronic Issues: Anxiety -- well controlled. Taking klonopin as needed. Needs refill.  Tremor -- well controlled with prn propranolol. Doesn't use as much now that she is working from home, uses more when in front of others.  Health Maintenance: Immunizations -- UTD Colonoscopy -- N/A Mammogram -- N/A PAP -- UTD, sees GYN, has an upcoming appt will have them send report. Bone Density -- N/A Diet -- eats well balanced diet Caffeine intake -- minimal Sleep habits -- no concerns Exercise -- started HIIT workout Current Weight -- Weight: 163 lb 6.1 oz (74.1 kg)  Weight History: Wt Readings from Last 10 Encounters:  02/20/20 163 lb 6.1 oz (74.1 kg)  04/22/19 154 lb (69.9 kg)  04/21/19 154 lb (69.9 kg)  04/12/19 155 lb (70.3 kg)  02/05/19 156 lb (70.8 kg)  02/05/18 157 lb (71.2 kg)  12/15/17 152 lb 9.6 oz (69.2 kg)  10/31/17 156 lb 8 oz (71 kg)  07/25/17 155 lb (70.3 kg)  06/01/17 153 lb (69.4 kg)   Body mass index is 25.59 kg/m. Mood -- stable Patient's last menstrual period was 02/20/2020. Period characteristics -- short and light Birth control -- OCPs  Depression screen PHQ 2/9 02/20/2020  Decreased Interest 0  Down, Depressed, Hopeless 0  PHQ - 2 Score 0     Other providers/specialists: Patient Care Team: Inda Coke, [place]  as PCP - General (Physician Assistant)   PMHx, SurgHx, SocialHx, Medications, and Allergies were reviewed in the Visit Navigator and updated as appropriate.   Past Medical History:  Diagnosis Date  . Anxiety      Past Surgical History:  Procedure Laterality Date  . APPENDECTOMY    . ARM WOUND REPAIR / CLOSURE  2016   broken arm  . LAPAROSCOPIC APPENDECTOMY N/A 11/15/2016   Procedure: APPENDECTOMY LAPAROSCOPIC;  Surgeon: Armandina Gemma, MD;  Location: [facility];  Service: General;  Laterality: N/A;  . WISDOM TOOTH EXTRACTION       Family History  Problem Relation Age of Onset  . Hypertension Mother   . Hypertension Father   . Hypertension Paternal Grandmother   . Hypertension Paternal Grandfather   . Diabetes Paternal Grandfather   . Breast cancer Neg Hx   . Colon cancer Neg Hx     Social History   Tobacco Use  . Smoking status: Never Smoker  . Smokeless tobacco: Never Used  Vaping Use  . Vaping Use: Never used  Substance Use Topics  . Alcohol use: Yes    Alcohol/week: 2.0 - 3.0 standard drinks    Types: 2 - 3 Cans of beer per week  . Drug use: No    Review of Systems:   Review of Systems  Constitutional: Negative for chills, fever, malaise/fatigue and weight loss.  HENT: Negative for hearing loss, sinus  pain and sore throat.   Respiratory: Negative for cough and hemoptysis.   Cardiovascular: Negative for chest pain, palpitations, leg swelling and PND.  Gastrointestinal: Negative for abdominal pain, constipation, diarrhea, heartburn, nausea and vomiting.  Genitourinary: Negative for dysuria, frequency and urgency.  Musculoskeletal: Positive for joint pain (R knee). Negative for back pain, myalgias and neck pain.  Skin: Negative for itching and rash.  Neurological: Negative for dizziness, tingling, seizures and headaches.  Endo/Heme/Allergies: Negative for polydipsia.  Psychiatric/Behavioral: Negative for depression. The patient is not nervous/anxious.      Objective:   BP 130/84 (BP Location: Left Arm, Patient Position: Sitting, Cuff Size: Normal)   Pulse 65   Temp 97.9 F (36.6 C) (Temporal)   Ht 5\' 7"  (1.702 m)   Wt 163 lb 6.1 oz (74.1 kg)   LMP 02/20/2020   SpO2 97%   BMI 25.59 kg/m   General Appearance:    Alert, cooperative, no distress, appears stated age  Head:    Normocephalic, without obvious abnormality, atraumatic  Eyes:    PERRL, conjunctiva/corneas clear, EOM's intact, fundi    benign, both eyes  Ears:    Normal TM's and external ear canals, both ears  Nose:   Nares normal, septum midline, mucosa normal, no drainage    or sinus tenderness  Throat:   Lips, mucosa, and tongue normal; teeth and gums normal  Neck:   Supple, symmetrical, trachea midline, no adenopathy;    thyroid:  no enlargement/tenderness/nodules; no carotid   bruit or JVD  Back:     Symmetric, no curvature, ROM normal, no CVA tenderness  Lungs:     Clear to auscultation bilaterally, respirations unlabored  Chest Wall:    No tenderness or deformity   Heart:    Regular rate and rhythm, S1 and S2 normal, no murmur, rub   or gallop  Breast Exam:    Deferred  Abdomen:     Soft, non-tender, bowel sounds active all four quadrants,    no masses, no organomegaly  Genitalia:    Deferred  Rectal:    Deferred  Extremities:   Extremities normal, atraumatic, no cyanosis Slight swelling to inferior R knee; normal ROM  Pulses:   2+ and symmetric all extremities  Skin:   Skin color, texture, turgor normal, no rashes or lesions  Lymph nodes:   Cervical, supraclavicular, and axillary nodes normal  Neurologic:   CNII-XII intact, normal strength, sensation and reflexes    throughout     Assessment/Plan:   [name] was seen today for annual exam.  Diagnoses and all orders for this visit:  Routine physical examination Today patient counseled on age appropriate routine health concerns for screening and prevention, each reviewed and up to date or declined.  Immunizations reviewed and up to date or declined. Labs ordered and reviewed. Risk factors for depression reviewed and negative. Hearing function and visual acuity are intact. ADLs screened and addressed as needed. Functional ability and level of safety reviewed and appropriate. Education, counseling and referrals performed based on assessed risks today. Patient provided with a copy of personalized plan for preventive services. -     Comprehensive metabolic panel; Future -     CBC with Differential/Platelet; Future -     CBC with Differential/Platelet -     Comprehensive metabolic panel  Encounter for lipid screening for cardiovascular disease -     Lipid panel; Future -     Lipid panel  Chronic pain of right knee Referral to Sports Medicine.  RICE discussed. -     Ambulatory referral to Sports Medicine  Essential tremor Well controlled with propranolol regimen. Continue to monitor.  Generalized anxiety disorder Well controlled with klonopin.  Continue to monitor.  Other orders -     clonazePAM (KLONOPIN) 0.5 MG tablet; Take 1 tablet (0.5 mg total) by mouth at bedtime.  Well Adult Exam: Labs ordered: Yes. Patient counseling was done. See below for items discussed. Discussed the patient's BMI. The BMI is in the acceptable range Follow up in one year.  Patient Counseling:   [x]     Nutrition: Stressed importance of moderation in sodium/caffeine intake, saturated fat and cholesterol, caloric balance, sufficient intake of fresh fruits, vegetables, fiber, calcium, iron, and 1 mg of folate supplement per day (for females capable of pregnancy).   [x]      Stressed the importance of regular exercise.    [x]     Substance Abuse: Discussed cessation/primary prevention of tobacco, alcohol, or other drug use; driving or other dangerous activities under the influence; availability of treatment for abuse.    [x]      Injury prevention: Discussed safety belts, safety helmets, smoke detector,  smoking near bedding or upholstery.    [x]      Sexuality: Discussed sexually transmitted diseases, partner selection, use of condoms, avoidance of unintended pregnancy  and contraceptive alternatives.    [x]     Dental health: Discussed importance of regular tooth brushing, flossing, and dental visits.   [x]      Health maintenance and immunizations reviewed. Please refer to Health maintenance section.   CMA or LPN served as scribe during this visit. History, Physical, and Plan performed by medical provider. The above documentation has been reviewed and is accurate and complete.  Inda Coke, PA-C [place]

## 2020-02-20 NOTE — Patient Instructions (Addendum)
It was great to see you!  Please go to the lab for blood work.   Refill for klonopin has been sent.  Referral for sports medicine placed.  Our office will call you with your results unless you have chosen to receive results via MyChart.  If your blood work is normal we will follow-up each year for physicals and as scheduled for chronic medical problems.  If anything is abnormal we will treat accordingly and get you in for a follow-up.  Take care,  Erin Byrd Maintenance, Female Adopting a healthy lifestyle and getting preventive care are important in promoting health and wellness. Ask your health care provider about:  The right schedule for you to have regular tests and exams.  Things you can do on your own to prevent diseases and keep yourself healthy. What should I know about diet, weight, and exercise? Eat a healthy diet   Eat a diet that includes plenty of vegetables, fruits, low-fat dairy products, and lean protein.  Do not eat a lot of foods that are high in solid fats, added sugars, or sodium. Maintain a healthy weight Body mass index (BMI) is used to identify weight problems. It estimates body fat based on height and weight. Your health care provider can help determine your BMI and help you achieve or maintain a healthy weight. Get regular exercise Get regular exercise. This is one of the most important things you can do for your health. Most adults should:  Exercise for at least 150 minutes each week. The exercise should increase your heart rate and make you sweat (moderate-intensity exercise).  Do strengthening exercises at least twice a week. This is in addition to the moderate-intensity exercise.  Spend less time sitting. Even light physical activity can be beneficial. Watch cholesterol and blood lipids Have your blood tested for lipids and cholesterol at 29 years of age, then have this test every 5 years. Have your cholesterol levels checked more  often if:  Your lipid or cholesterol levels are high.  You are older than 29 years of age.  You are at high risk for heart disease. What should I know about cancer screening? Depending on your health history and family history, you may need to have cancer screening at various ages. This may include screening for:  Breast cancer.  Cervical cancer.  Colorectal cancer.  Skin cancer.  Lung cancer. What should I know about heart disease, diabetes, and high blood pressure? Blood pressure and heart disease  High blood pressure causes heart disease and increases the risk of stroke. This is more likely to develop in people who have high blood pressure readings, are of African descent, or are overweight.  Have your blood pressure checked: ? Every 3-5 years if you are 86-6 years of age. ? Every year if you are 33 years old or older. Diabetes Have regular diabetes screenings. This checks your fasting blood sugar level. Have the screening done:  Once every three years after age 53 if you are at a normal weight and have a low risk for diabetes.  More often and at a younger age if you are overweight or have a high risk for diabetes. What should I know about preventing infection? Hepatitis B If you have a higher risk for hepatitis B, you should be screened for this virus. Talk with your health care provider to find out if you are at risk for hepatitis B infection. Hepatitis C Testing is recommended for:  Everyone born from  1945 through [birth year].  Anyone with known risk factors for hepatitis C. Sexually transmitted infections (STIs)  Get screened for STIs, including gonorrhea and chlamydia, if: ? You are sexually active and are younger than 29 years of age. ? You are older than 29 years of age and your health care provider tells you that you are at risk for this type of infection. ? Your sexual activity has changed since you were last screened, and you are at increased risk for chlamydia  or gonorrhea. Ask your health care provider if you are at risk.  Ask your health care provider about whether you are at high risk for HIV. Your health care provider may recommend a prescription medicine to help prevent HIV infection. If you choose to take medicine to prevent HIV, you should first get tested for HIV. You should then be tested every 3 months for as long as you are taking the medicine. Pregnancy  If you are about to stop having your period (premenopausal) and you may become pregnant, seek counseling before you get pregnant.  Take 400 to 800 micrograms (mcg) of folic acid every day if you become pregnant.  Ask for birth control (contraception) if you want to prevent pregnancy. Osteoporosis and menopause Osteoporosis is a disease in which the bones lose minerals and strength with aging. This can result in bone fractures. If you are 94 years old or older, or if you are at risk for osteoporosis and fractures, ask your health care provider if you should:  Be screened for bone loss.  Take a calcium or vitamin D supplement to lower your risk of fractures.  Be given hormone replacement therapy (HRT) to treat symptoms of menopause. Follow these instructions at home: Lifestyle  Do not use any products that contain nicotine or tobacco, such as cigarettes, e-cigarettes, and chewing tobacco. If you need help quitting, ask your health care provider.  Do not use street drugs.  Do not share needles.  Ask your health care provider for help if you need support or information about quitting drugs. Alcohol use  Do not drink alcohol if: ? Your health care provider tells you not to drink. ? You are pregnant, may be pregnant, or are planning to become pregnant.  If you drink alcohol: ? Limit how much you use to 0-1 drink a day. ? Limit intake if you are breastfeeding.  Be aware of how much alcohol is in your drink. In the [place] one drink equals one 12 oz bottle of beer (355 mL), one 5 oz  glass of wine (148 mL), or one 1 oz glass of hard liquor (44 mL). General instructions  Schedule regular health, dental, and eye exams.  Stay current with your vaccines.  Tell your health care provider if: ? You often feel depressed. ? You have ever been abused or do not feel safe at home. Summary  Adopting a healthy lifestyle and getting preventive care are important in promoting health and wellness.  Follow your health care provider's instructions about healthy diet, exercising, and getting tested or screened for diseases.  Follow your health care provider's instructions on monitoring your cholesterol and blood pressure. This information is not intended to replace advice given to you by your health care provider. Make sure you discuss any questions you have with your health care provider. Document Revised: 06/12/2018 Document Reviewed: 06/12/2018 Elsevier Patient Education  2020 [employer].

## 2020-03-03 ENCOUNTER — Encounter: Payer: Self-pay | Admitting: Family Medicine

## 2020-03-03 ENCOUNTER — Ambulatory Visit: Payer: Self-pay

## 2020-03-03 ENCOUNTER — Ambulatory Visit (INDEPENDENT_AMBULATORY_CARE_PROVIDER_SITE_OTHER): Payer: 59 | Admitting: Family Medicine

## 2020-03-03 ENCOUNTER — Other Ambulatory Visit: Payer: Self-pay

## 2020-03-03 VITALS — BP 100/80 | HR 77 | Ht 67.0 in | Wt 167.0 lb

## 2020-03-03 DIAGNOSIS — M7672 Peroneal tendinitis, left leg: Secondary | ICD-10-CM

## 2020-03-03 DIAGNOSIS — M25561 Pain in right knee: Secondary | ICD-10-CM

## 2020-03-03 DIAGNOSIS — M7651 Patellar tendinitis, right knee: Secondary | ICD-10-CM

## 2020-03-03 DIAGNOSIS — G8929 Other chronic pain: Secondary | ICD-10-CM

## 2020-03-03 NOTE — Progress Notes (Signed)
Subjective:   I Erin Byrd am serving as a Education administrator for Dr. Lynne Leader.  I'm seeing this patient as a consultation for:  Erin Byrd, [place]. Note will be routed back to referring provider/PCP.  CC: R knee and L ankle pain  HPI: Pt is a 29 y/o female presenting w/ c/o R knee and L ankle pain. Right knee is sore. Pain radiates distally. Patient is active and works out with a Clinical research associate. States she has seen progress since the initially injury. Wants to know what not to do.   R knee: Pain x one month. -Swelling: yes initially anterior. Swelling not as bad lately.  -Mechanical symptoms: Lunges, squats, extension -Aggravating factors: HIIT workouts -Treatments tried: ice, OTC anti-inflmmatories, ice  L ankle: Lateral (peroneal) States she heard a loud pop that caused pain. Walking in the sand this past weekend caused soreness. Pain radiates up the leg. -Swelling: yes initially  -Aggravating factors: inversion an eversion  -Treatments tried: No   Past medical history, Surgical history, Family history, Social history, Allergies, and medications have been entered into the medical record, reviewed.   Review of Systems: No new headache, visual changes, nausea, vomiting, diarrhea, constipation, dizziness, abdominal pain, skin rash, fevers, chills, night sweats, weight loss, swollen lymph nodes, body aches, joint swelling, muscle aches, chest pain, shortness of breath, mood changes, visual or auditory hallucinations.   Objective:    Vitals:   03/03/20 1510  BP: 100/80  Pulse: 77  SpO2: 98%   General: Well Developed, well nourished, and in no acute distress.  Neuro/Psych: Alert and oriented x3, extra-ocular muscles intact, able to move all 4 extremities, sensation grossly intact. Skin: Warm and dry, no rashes noted.  Respiratory: Not using accessory muscles, speaking in full sentences, trachea midline.  Cardiovascular: Pulses palpable, no extremity edema. Abdomen: Does not appear  distended. MSK:  Right knee normal. Normal motion. Mildly tender palpation anterior knee patellar tendon.  Palpable squeak overlying patellar tendon. Stable ligamentous exam. Intact strength.   Left ankle normal-appearing Normal motion. Mildly tender palpation along the course of the peroneal tendon.  Palpable snap in this location with ankle motion. Strength is intact. Stable ligamentous exam.  Lab and Radiology Results  Diagnostic Limited MSK Ultrasound of: Right knee Quad tendon normal-appearing No significant joint effusion superior patellar space. Patellar tendon normal. Trace hyperechoic fluid superficial to tendon may be within normal limits. Medial and lateral joint line normal-appearing Posterior knee no Baker's cyst. Impression: Normal-appearing knee ultrasound  Diagnostic Limited MSK Ultrasound of: Left lateral ankle Peroneal tendons visualized.  Hypoechoic fluid tracking within tendon sheath of peroneal tendons in just distal to lateral malleolus area of maximum tenderness. At lateral malleolus with dynamic imaging tendon snap/subluxation visible. Impression: Snapping peroneal tendons with tenosynovitis    Impression and Recommendations:    Assessment and Plan: 29 y.o. female with left ankle pain is peroneal tenosynovitis with snapping tendon.  May have a linear split tear as well.  Plan for physical therapy Voltaren gel and compressive ankle sleeve.  Consider nitroglycerin patch protocol in the future and or injection in the future if needed.  Knee pain: Anterior pain due to prepatellar bursitis/patellar tendinitis/patellofemoral pain syndrome.  Begin physical therapy and Voltaren gel.  Activity modification discussed.  Recheck in 4 weeks if not improved.Marland Kitchen  PDMP not reviewed this encounter. Orders Placed This Encounter  Procedures  . [place] LIMITED JOINT SPACE STRUCTURES LOW RIGHT(NO LINKED CHARGES)    Order Specific Question:   Reason for Exam (SYMPTOM  OR  DIAGNOSIS REQUIRED)    Answer:   right knee pain    Order Specific Question:   Preferred imaging location?    Answer:   Internal  . Ambulatory referral to Physical Therapy    Referral Priority:   Routine    Referral Type:   Physical Medicine    Referral Reason:   Specialty Services Required    Requested Specialty:   Physical Therapy   No orders of the defined types were placed in this encounter.   Discussed warning signs or symptoms. Please see discharge instructions. Patient expresses understanding.   The above documentation has been reviewed and is accurate and complete Lynne Leader, M.D.

## 2020-03-03 NOTE — Patient Instructions (Signed)
Thank you for coming in today.  Use voltaren gel on both.  Use body helix full ankle sleeve.   I recommend you obtained a compression sleeve to help with your joint problems. There are many options on the market however I recommend obtaining a Full Ankle  Body Helix compression sleeve.  You can find information (including how to appropriate measure yourself for sizing) can be found at www.Body http://www.lambert.com/.  Many of these products are health savings account (HSA) eligible.   You can use the compression sleeve at any time throughout the day but is most important to use while being active as well as for 2 hours post-activity.   It is appropriate to ice following activity with the compression sleeve in place.  Plan for PT.   Let me know if this is not working well.  Recheck in 4-6 weeks if needed.    Peroneal Tendon Subluxation and Dislocation Rehab Ask your health care provider which exercises are safe for you. Do exercises exactly as told by your health care provider and adjust them as directed. It is normal to feel mild stretching, pulling, tightness, or discomfort as you do these exercises. Stop right away if you feel sudden pain or your pain gets worse. Do not begin these exercises until told by your health care provider. Stretching and range-of-motion exercises These exercises warm up your muscles and joints and improve the movement and flexibility of your ankle. These exercises also help to relieve pain and stiffness. Passive ankle plantar flexion  1. Sit with your left / right leg crossed over your opposite knee. 2. With your left / right hand, pull the front of your foot and toes toward you (plantar flexion). You should feel a gentle stretch on the top of your foot and ankle. 3. Hold this position for __________ seconds. Repeat __________ times. Complete this exercise __________ times a day. Ankle alphabet  1. Sit with your left / right leg supported at the lower leg. ? Do not rest  your foot on anything. ? Make sure your foot has room to move freely. 2. Think of your left / right foot as a paintbrush, and move your foot to trace each letter of the alphabet in the air. Keep your hip and knee still while you trace the letters. 3. Trace every letter of the alphabet. Repeat __________ times. Complete this exercise __________ times a day. Gastrocnemius stretch, standing This exercise is also called a calf stretch. It stretches the muscles in the back of the upper calf. 1. Stand with your hands against a wall. 2. Extend your left / right leg behind you, and bend your front knee slightly. Your heels should be on the floor. 3. Keeping your heels on the floor and your back knee straight, shift your weight toward the wall. Do not arch your back. You should feel a gentle stretch in the back of your lower leg (gastrocnemius). 4. Hold this position for __________ seconds. Repeat __________ times. Complete this exercise __________ times a day. Soleus stretch, standing This exercise is also called a calf stretch. It stretches the muscles in the back of the lower calf. 1. Stand with your hands against a wall. 2. Extend your left / right leg behind you, and bend your front knee slightly. Your heels should be on the floor. 3. Keeping your heels on the floor, bend your back knee and shift your weight slightly over your back leg. You should feel a gentle stretch deep in your calf (  soleus). 4. Hold this position for __________ seconds. Repeat __________ times. Complete this exercise __________ times a day. Strengthening exercises These exercises help to build strength and endurance in your toes, foot, and ankle. Endurance is the ability to use your muscles for a long time, even after they get tired. Towel curls  1. Sit in a chair on a non-carpeted surface, and put your feet on the floor. 2. Place a towel in front of your feet. If told by your health care provider, add __________ to the end  of the towel. 3. Keeping your heel on the floor, put your left / right toes on the towel. 4. Pull the towel toward you by grabbing the towel with your toes and curling them under. Keep your heel on the floor. 5. Let your toes relax. 6. Grab the towel again. Keep going until the towel is completely underneath your foot. Repeat __________ times. Complete this exercise __________ times a day. Isometric eversion 1. Sit in a chair with the outer edge of your left / right foot against a sturdy object, such as a wall or a table leg. 2. Slowly push the outer edge of your foot out against the wall or table leg (eversion). Your foot should not rotate (isometric). 3. Hold this position for __________ seconds. Repeat __________ times. Complete this exercise __________ times a day. This information is not intended to replace advice given to you by your health care provider. Make sure you discuss any questions you have with your health care provider. Document Revised: 10/10/2018 Document Reviewed: 08/29/2018 Elsevier Patient Education  [place].    Patellar Tendinitis Rehab Ask your health care provider which exercises are safe for you. Do exercises exactly as told by your health care provider and adjust them as directed. It is normal to feel mild stretching, pulling, tightness, or discomfort as you do these exercises. Stop right away if you feel sudden pain or your pain gets worse. Do not begin these exercises until told by your health care provider. Stretching and range-of-motion exercise This exercise warms up your muscles and joints and improves the movement and flexibility of your knee. The exercise also helps to relieve pain and stiffness. Hamstring, doorway stretch This is an exercise in which you lie in a doorway and prop your leg on a wall to stretch the back of your knee and thigh (hamstring). 1. Lie on your back in front of a doorway with your left / right leg resting against the wall  and your other leg flat on the floor in the doorway. There should be a slight bend in your left / right knee. 2. Straighten your left / right knee. You should feel a stretch behind your knee or thigh. If you do not, scoot your buttocks closer to the door. 3. Hold this position for __________ seconds. Repeat __________ times. Complete this exercise __________ times a day. Strengthening exercises These exercises build strength and endurance in your knee. Endurance is the ability to use your muscles for a long time, even after they get tired. Quadriceps, isometric This exercise stretches the muscles in front of your thigh (quadriceps) without moving your knee joint (isometric). 1. Lie on your back with your left / right leg extended and your other knee bent. 2. Slowly tense the muscles in the front of your left / right thigh. When you do this, you should see your kneecap slide up toward your hip or see increased dimpling just above the knee. This motion  will push the back of your knee toward the floor. If this is painful, try putting a rolled-up hand towel under your knee to support it in a bent position. Change the size of the towel to find a position that allows you to do this exercise without any pain. 3. For __________ seconds, hold the muscle as tight as you can without increasing your pain. 4. Relax the muscles slowly and completely. Repeat __________ times. Complete this exercise __________ times a day. Straight leg raises This exercise stretches the muscles in front of your thigh (quadriceps) and the muscles that move your hips (hip flexors). 1. Lie on your back with your left / right leg extended and your other knee bent. 2. Tense the muscles in the front of your left / right thigh. When you do this, you should see your kneecap slide up or see increased dimpling just above the knee. 3. Keep these muscles tight as you raise your leg 4-6 inches (10-15 cm) off the floor. Do not let your moving  knee bend. 4. Hold this position for __________ seconds. 5. Keep these muscles tense as you slowly lower your leg. 6. Relax your muscles slowly and completely. Repeat __________ times. Complete this exercise __________ times a day. Squats This is a weight-bearing exercise in which you bend your knees and lower your hips while engaging your thigh muscles. 1. Stand in front of a table, with your feet and knees pointing straight ahead. You may rest your hands on the table for balance but not for support. 2. Slowly bend your knees and lower your hips like you are going to sit in a chair. ? Keep your weight over your heels, not over your toes. ? Keep your lower legs upright so they are parallel with the table legs. ? Do not let your hips go lower than your knees. ? Do not bend lower than told by your health care provider. ? If your knee pain increases, do not bend as low. 3. Hold the squat position for __________ seconds. 4. Slowly push with your legs to return to standing. Do not use your hands to pull yourself to standing. Repeat __________ times. Complete this exercise __________ times a day. Step-downs This is an exercise in which you step down slowly while engaging your leg muscles. 1. Stand on the edge of a step. 2. Keeping your weight over your __________ heel, slowly bend your __________ knee to bring your __________ heel toward the floor. Lower your heel as far as you can while keeping control and without increasing any discomfort. ? Do not let your __________ knee come forward. ? Use your leg muscles, not gravity, to lower your body. ? Hold a wall or rail for balance if needed. 3. Slowly push through your heel to lift your body weight back up. 4. Return to the starting position. Repeat __________ times. Complete this exercise __________ times a day. Straight leg raises This exercise strengthens the muscles that rotate the leg at the hip and move it away from your body (hip  abductors). 1. Lie on your side with your left / right leg in the top position. Lie so your head, shoulder, knee, and hip line up. You may bend your lower knee to help you keep your balance. 2. Roll your hips slightly forward, so that your hips are stacked directly over each other and your left / right knee is facing forward. 3. Leading with your heel, lift your top leg 4-6 inches (10-15 cm).  You should feel the muscles in your outer hip lifting. ? Do not let your foot drift forward. ? Do not let your knee roll toward the ceiling. 4. Hold this position for __________ seconds. 5. Slowly lower your leg to the starting position. 6. Let your muscles relax completely after each repetition. Repeat __________ times. Complete this exercise __________ times a day. This information is not intended to replace advice given to you by your health care provider. Make sure you discuss any questions you have with your health care provider. Document Revised: 10/10/2018 Document Reviewed: 04/09/2018 Elsevier Patient Education  [place].

## 2020-03-04 ENCOUNTER — Other Ambulatory Visit (HOSPITAL_BASED_OUTPATIENT_CLINIC_OR_DEPARTMENT_OTHER): Payer: Self-pay | Admitting: Obstetrics and Gynecology

## 2020-03-04 DIAGNOSIS — Z01419 Encounter for gynecological examination (general) (routine) without abnormal findings: Secondary | ICD-10-CM | POA: Diagnosis not present

## 2020-03-09 ENCOUNTER — Ambulatory Visit: Payer: 59 | Admitting: Family Medicine

## 2020-03-09 MED FILL — TAYTULLA 1 MG-20 MCG CAP: 1-20 | 28 days supply | Qty: 28 | Fill #0

## 2020-03-15 ENCOUNTER — Ambulatory Visit (INDEPENDENT_AMBULATORY_CARE_PROVIDER_SITE_OTHER): Payer: 59 | Admitting: Rehabilitative and Restorative Service Providers"

## 2020-03-15 ENCOUNTER — Encounter: Payer: Self-pay | Admitting: Rehabilitative and Restorative Service Providers"

## 2020-03-15 ENCOUNTER — Other Ambulatory Visit: Payer: Self-pay

## 2020-03-15 DIAGNOSIS — R293 Abnormal posture: Secondary | ICD-10-CM

## 2020-03-15 DIAGNOSIS — M94261 Chondromalacia, right knee: Secondary | ICD-10-CM

## 2020-03-15 DIAGNOSIS — R29898 Other symptoms and signs involving the musculoskeletal system: Secondary | ICD-10-CM

## 2020-03-15 DIAGNOSIS — M25561 Pain in right knee: Secondary | ICD-10-CM | POA: Diagnosis not present

## 2020-03-15 DIAGNOSIS — M25572 Pain in left ankle and joints of left foot: Secondary | ICD-10-CM

## 2020-03-15 NOTE — Patient Instructions (Signed)
Access Code: [email or web address] URL: [email or web address] Date: 09/13/2021Prepared by: Carlyle  Supine Quadricep Sets - 1 x daily - 7 x weekly - 1 sets - 10 reps - 10 sec hold  Straight Leg Raise with External Rotation - 1 x daily - 7 x weekly - 1 sets - 10 reps  Supine ITB Stretch with Strap - 2 x daily - 7 x weekly - 1 sets - 3 reps - 30 hold  Supine Piriformis Stretch with Leg Straight - 2 x daily - 7 x weekly - 1 sets - 3 reps - 30 sec hold Patient Education  [place] Patient Instructions

## 2020-03-15 NOTE — Therapy (Signed)
[place], [place], [address] Phone: [phone]   Fax:  [phone]  Physical Therapy Evaluation  Patient Details  Name: Erin Byrd MRN: [id number] Date of Birth: 04-Jul-1990 Referring Provider (PT): Dr Georgina Snell    Encounter Date: 03/15/2020   PT End of Session - 03/15/20 1702    Visit Number 1    Number of Visits 12    Date for PT Re-Evaluation 04/26/20    PT Start Time 7793    PT Stop Time 1605    PT Time Calculation (min) 48 min    Activity Tolerance Patient tolerated treatment well           Past Medical History:  Diagnosis Date   Anxiety     Past Surgical History:  Procedure Laterality Date   APPENDECTOMY     ARM WOUND REPAIR / CLOSURE  2016   broken arm   LAPAROSCOPIC APPENDECTOMY N/A 11/15/2016   Procedure: APPENDECTOMY LAPAROSCOPIC;  Surgeon: Armandina Gemma, MD;  Location: [facility];  Service: General;  Laterality: N/A;   WISDOM TOOTH EXTRACTION      There were no vitals filed for this visit.    Subjective Assessment - 03/15/20 1526    Subjective Patient reports Rt knee pain since 7/21 working with trainer doing some running. noticed pain with ascending or descending steps or moving side to side. Knee pain is intermittent and increased with certain shoes or going up and down stairs. She has pain in the Lt ankle starting about 2-3 weeks after knee pain started. Felt a pop and pain in the lateral ankle. pain is increased with wearing heels and walking in sand. Moving foot inward increases the pain.    Pertinent History appendectomy 2018; Fx with ORIF Rt radius 2014    Currently in Pain? Yes    Pain Score 1     Pain Location Knee    Pain Orientation Right    Pain Descriptors / Indicators Dull;Sore    Pain Type Acute pain    Pain Radiating Towards knee cap    Pain Onset More than a month ago    Pain Frequency Intermittent    Aggravating Factors  stairs; squats; some shoes;  standing; walking    Pain Relieving Factors ice OTC meds    Pain Score 3    Pain Location Ankle    Pain Orientation Left    Pain Descriptors / Indicators Dull;Sore    Pain Type Acute pain    Pain Radiating Towards lateral calf toward knee    Pain Onset More than a month ago    Pain Frequency Intermittent    Aggravating Factors  certain shoes; walking on unlevel surfaces(sand/grass)    Pain Relieving Factors ice OTC meds              [facility] PT Assessment - 03/15/20 0001      Assessment   Medical Diagnosis Rt knee pain; Lt ankle pain     Referring Provider (PT) Dr Georgina Snell     Onset Date/Surgical Date 01/04/20    Hand Dominance Left    Next MD Visit PRN     Prior Therapy none       Precautions   Precautions None      Restrictions   Weight Bearing Restrictions No      Balance Screen   Has the patient fallen in the past 6 months No    Has the patient had a decrease in activity  level because of a fear of falling?  No    Is the patient reluctant to leave their home because of a fear of falling?  No      Prior Function   Level of Independence Independent    Vocation Full time employment    Insurance claims handler for [employer]     Leisure working out with trainer 2x/wk - was doing HIIT now less intense; Y for cardio; household chores;       Observation/Other Assessments   Focus on Therapeutic Outcomes (FOTO)  31% limitation       Sensation   Additional Comments WFL's per pt report       Posture/Postural Control   Posture Comments stands with bilat knees hyperextended       AROM   Right/Left Hip --   bilat WFL's equal bilat    Right/Left Knee --   WFL's equal bilat    Right/Left Ankle --   WFL's equal bilat - paiin w/Lt ankle inversion      Strength   Right/Left Hip --   WFL's bilat    Right/Left Knee --   WFL's bilat discomfort Rt knee ext    Right/Left Ankle --   WFL's equal bilat    Left Ankle Eversion --   pain with resistive testing      Flexibility    Hamstrings tight end ranges bilat     Quadriceps tight Rt > Lt end ranges    ITB tight Rt > Lt     Piriformis tight Rt > Lt       Palpation   Patella mobility lateral tracking of Rt > Lt patellae; pain with palpation Rt superior lateral border Rt patella; pain with patellar grind     Palpation comment muscular tightness along distal lateral quad; lateral ankle through the peroneal tendon with area of pain posterior lateral malleolus                       Objective measurements completed on examination: See above findings.       [facility] Adult PT Treatment/Exercise - 03/15/20 0001      Neuro Re-ed    Neuro Re-ed Details  working on standing without hyperextension of knees       Exercises   Exercises Knee/Hip      Knee/Hip Exercises: Stretches   ITB Stretch Right;2 reps;30 seconds   supine with strap   Piriformis Stretch Right;2 reps;30 seconds   travell     Knee/Hip Exercises: Supine   Quad Sets Strengthening;Right;1 set;5 reps   10 sec hold   Straight Leg Raise with External Rotation Strengthening;Right;1 set;5 reps   5 sec hold     Modalities   Modalities Iontophoresis      Iontophoresis   Type of Iontophoresis Dexamethasone    Location Lt lateral ankle    Dose 1.0 cc    Time 8 hr, 120 mA/min                   PT Education - 03/15/20 1558    Education Details Ionto info, POC, HEP    Person(s) Educated Patient    Methods Explanation;Handout;Verbal cues    Comprehension Verbalized understanding;Returned demonstration               PT Long Term Goals - 03/15/20 1714      PT LONG TERM GOAL #1   Title Improve posture and alignment with patient to demonstrate controlled  LE alignment without standing with knees hyperextended    Time 6    Period Weeks    Status New    Target Date 04/26/20      PT LONG TERM GOAL #2   Title Decrease pain by 75-100% allowing patient to stand and walk with no pain and ascend/descend steps with 0-1/10 pain     Time 6    Period Weeks    Status New    Target Date 04/26/20      PT LONG TERM GOAL #3   Title Return to workouts with trainer with no increase in symptoms in the knees or ankles    Time 6    Period Weeks    Status New    Target Date 04/26/20      PT LONG TERM GOAL #4   Title Independent in HEP    Time 6    Period Weeks    Status New    Target Date 04/26/20      PT LONG TERM GOAL #5   Title Improve FOTO to </= 16% limitation    Time 6    Period Weeks    Status New    Target Date 04/26/20                  Plan - 03/15/20 1555    Clinical Impression Statement Patient presents with 3 month history of Rt knee pain and 2.5 month history of Lt lateral ankle pain. Symptoms were exacerbated by running and HIIT workouts. Patient has poor posture exhibiting hyperextensin of bilat knees in standing; decreased balance Lt LE reporting increased Lt ankle pain with SLS on Lt LE. She has poor tracking of Rt > Lt patellae; pain and muscular tightness through the Rt lateral quad to insertion of lateral quad at superior lateral border of patella as well as the Lt ankle lateral and  posterior to the lateral malleolus at peroneal tendons. Patient has pain with resistive testing Lt ankle eversion. She will benefit from PT to address problems identified.    Stability/Clinical Decision Making Stable/Uncomplicated    Clinical Decision Making Low    Rehab Potential Good    PT Frequency 2x / week    PT Duration 6 weeks    PT Treatment/Interventions Patient/family education;ADLs/Self Care Home Management;Aquatic Therapy;Cryotherapy;Electrical Stimulation;Iontophoresis 4mg /ml Dexamethasone;Moist Heat;Ultrasound;Gait training;Stair training;Functional mobility training;Therapeutic activities;Therapeutic exercise;Balance training;Neuromuscular re-education;Manual techniques;Dry needling;Taping    PT Next Visit Plan review HEP; taping to correct patellar alignment; strengthening medial quad; inhibit  lateral quad(transverse friction massage superior lateral patellar border/myofacial work lateral quad); taping knee and possibly ankle; may try tape to inhibit hyperextension of knees in standing as well as tape to correct patellar alignment; strengthening; progression of exercise to increased intensity; manual work vs DN as indicated; modalities as indicated    PT Home Exercise Plan Access Code: [email or web address]    Consulted and Agree with Plan of Care Patient           Patient will benefit from skilled therapeutic intervention in order to improve the following deficits and impairments:  Pain, Decreased activity tolerance, Decreased balance, Improper body mechanics, Postural dysfunction  Visit Diagnosis: Acute pain of right knee - Plan: PT plan of care cert/re-cert  Chondromalacia of knee, right - Plan: PT plan of care cert/re-cert  Pain in left ankle and joints of left foot - Plan: PT plan of care cert/re-cert  Abnormal posture - Plan: PT plan of care cert/re-cert  Other symptoms and signs involving the  musculoskeletal system - Plan: PT plan of care cert/re-cert     Problem List Patient Active Problem List   Diagnosis Date Noted   Microscopic hematuria 11/02/2018   Generalized anxiety disorder 11/24/2016   Acute appendicitis s/p lap appendectomy 11/15/2016 11/15/2016   Essential tremor 04/10/2014   External hemorrhoid 04/10/2014    Olivine Hiers Nilda Simmer PT, MPH  03/15/2020, 5:20 PM  [facility] [place], [place], [address] Phone: [phone]   Fax:  [phone]  Name: Giamarie Bueche MRN: [id number] Date of Birth: 07/29/1990

## 2020-03-18 ENCOUNTER — Ambulatory Visit (INDEPENDENT_AMBULATORY_CARE_PROVIDER_SITE_OTHER): Payer: 59 | Admitting: Physical Therapy

## 2020-03-18 ENCOUNTER — Other Ambulatory Visit: Payer: Self-pay

## 2020-03-18 DIAGNOSIS — M25572 Pain in left ankle and joints of left foot: Secondary | ICD-10-CM

## 2020-03-18 DIAGNOSIS — M25561 Pain in right knee: Secondary | ICD-10-CM

## 2020-03-18 DIAGNOSIS — M94261 Chondromalacia, right knee: Secondary | ICD-10-CM

## 2020-03-18 DIAGNOSIS — R293 Abnormal posture: Secondary | ICD-10-CM

## 2020-03-18 NOTE — Patient Instructions (Signed)
Access Code: [email or web address]: [email or web address]: 09/16/2021Prepared by: Henry  Supine Quadricep Sets - 1 x daily - 7 x weekly - 1 sets - 10 reps - 10 sec hold  Straight Leg Raise with External Rotation - 1 x daily - 7 x weekly - 1 sets - 10 reps  Supine ITB Stretch with Strap - 2 x daily - 7 x weekly - 1 sets - 3 reps - 30 hold  Supine Piriformis Stretch with Leg Straight - 2 x daily - 7 x weekly - 1 sets - 3 reps - 30 sec hold  Seated Table Piriformis Stretch - 2 x daily - 7 x weekly - 1 sets - 2 reps - 15-30 sec hold

## 2020-03-18 NOTE — Therapy (Signed)
[place], [place], [address] Phone: [phone]   Fax:  [phone]  Physical Therapy Treatment  Patient Details  Name: Erin Byrd MRN: [id number] Date of Birth: 17-Apr-1991 Referring Provider (PT): Dr Georgina Snell    Encounter Date: 03/18/2020   PT End of Session - 03/18/20 1535    Visit Number 2    Number of Visits 12    Date for PT Re-Evaluation 04/26/20    PT Start Time 1532    PT Stop Time 1616    PT Time Calculation (min) 44 min    Activity Tolerance Patient tolerated treatment well           Past Medical History:  Diagnosis Date  . Anxiety     Past Surgical History:  Procedure Laterality Date  . APPENDECTOMY    . ARM WOUND REPAIR / CLOSURE  2016   broken arm  . LAPAROSCOPIC APPENDECTOMY N/A 11/15/2016   Procedure: APPENDECTOMY LAPAROSCOPIC;  Surgeon: Armandina Gemma, MD;  Location: [facility];  Service: General;  Laterality: N/A;  . WISDOM TOOTH EXTRACTION      There were no vitals filed for this visit.   Subjective Assessment - 03/18/20 1538    Subjective Pt reports exercises are going well.  She has no ankle pain unless she forces her ankle into inversion.  She did some deep cleaning yesterday and didn't not have much pain or popping in knee.   No skin reaction to ionto patch.    Currently in Pain? No/denies    Pain Score 0-No pain              [facility] PT Assessment - 03/18/20 0001      Assessment   Medical Diagnosis Rt knee pain; Lt ankle pain     Referring Provider (PT) Dr Georgina Snell     Onset Date/Surgical Date 01/04/20    Hand Dominance Left    Next MD Visit PRN     Prior Therapy none             [facility] Adult PT Treatment/Exercise - 03/18/20 0001      Knee/Hip Exercises: Stretches   Pension scheme manager reps;Left;1 rep;20 seconds   standing   Quad Stretch Limitations cues for alignment.     ITB Stretch Right;Left;2 reps;20 seconds   supine with strap, cues for form.     Piriformis Stretch Right;Left;2 reps;30 seconds    Gastroc Stretch Right;Left;2 reps;20 seconds   heel off of step     Knee/Hip Exercises: Aerobic   Recumbent Bike L2:  4.5 min       Knee/Hip Exercises: Standing   Wall Squat 1 set;5 reps;10 seconds   3# wt in/out from core. cues for even wt between legs.      Knee/Hip Exercises: Supine   Quad Sets Strengthening;Both;1 set;5 reps   10 sec hold   Quad Sets Limitations pillow behind knee to prevent hyperextension    Straight Leg Raise with External Rotation Strengthening;Right;1 set;10 reps   5 sec hold         Kinesiotaping: 3" dynamic tape applied to Rt lateral knee in K shape for improved patellar tracking. Pt instructed in safe tape removal;verbalized understanding.      PT Long Term Goals - 03/15/20 1714      PT LONG TERM GOAL #1   Title Improve posture and alignment with patient to demonstrate controlled LE alignment without standing with knees hyperextended    Time 6  Period Weeks    Status New    Target Date 04/26/20      PT LONG TERM GOAL #2   Title Decrease pain by 75-100% allowing patient to stand and walk with no pain and ascend/descend steps with 0-1/10 pain    Time 6    Period Weeks    Status New    Target Date 04/26/20      PT LONG TERM GOAL #3   Title Return to workouts with trainer with no increase in symptoms in the knees or ankles    Time 6    Period Weeks    Status New    Target Date 04/26/20      PT LONG TERM GOAL #4   Title Independent in HEP    Time 6    Period Weeks    Status New    Target Date 04/26/20      PT LONG TERM GOAL #5   Title Improve FOTO to </= 16% limitation    Time 6    Period Weeks    Status New    Target Date 04/26/20                 Plan - 03/18/20 2035    Clinical Impression Statement Pt reported reduction in tightness in LEs after completing exercises today; no increase in pain. Minor cues for form for exercises.  Trial of dynamic tape applied to Rt knee for  improved patellar tracking.  goals are ongoing.    Stability/Clinical Decision Making Stable/Uncomplicated    Rehab Potential Good    PT Frequency 2x / week    PT Duration 6 weeks    PT Treatment/Interventions Patient/family education;ADLs/Self Care Home Management;Aquatic Therapy;Cryotherapy;Electrical Stimulation;Iontophoresis 4mg /ml Dexamethasone;Moist Heat;Ultrasound;Gait training;Stair training;Functional mobility training;Therapeutic activities;Therapeutic exercise;Balance training;Neuromuscular re-education;Manual techniques;Dry needling;Taping    PT Next Visit Plan review HEP; assess response to taping to correct patellar alignment; strengthening medial quad; inhibit lateral quad(transverse friction massage superior lateral patellar border/myofacial work lateral quad); taping knee and possibly ankle; may try tape to inhibit hyperextension of knees in standing as well as tape to correct patellar alignment; strengthening; progression of exercise to increased intensity; manual work vs DN as indicated; modalities as indicated    PT Home Exercise Plan Access Code: [email or web address]    Consulted and Agree with Plan of Care Patient           Patient will benefit from skilled therapeutic intervention in order to improve the following deficits and impairments:  Pain, Decreased activity tolerance, Decreased balance, Improper body mechanics, Postural dysfunction  Visit Diagnosis: Acute pain of right knee  Chondromalacia of knee, right  Pain in left ankle and joints of left foot  Abnormal posture     Problem List Patient Active Problem List   Diagnosis Date Noted  . Microscopic hematuria 11/02/2018  . Generalized anxiety disorder 11/24/2016  . Acute appendicitis s/p lap appendectomy 11/15/2016 11/15/2016  . Essential tremor 04/10/2014  . External hemorrhoid 04/10/2014   Kerin Perna, PTA 03/18/20 6:34 PM  [place]  [place], [place], [address] Phone: [phone]   Fax:  [phone]  Name: Kallee Nam MRN: [id number] Date of Birth: 06-14-1991

## 2020-03-22 ENCOUNTER — Other Ambulatory Visit: Payer: Self-pay

## 2020-03-22 ENCOUNTER — Encounter: Payer: Self-pay | Admitting: Rehabilitative and Restorative Service Providers"

## 2020-03-22 ENCOUNTER — Ambulatory Visit (INDEPENDENT_AMBULATORY_CARE_PROVIDER_SITE_OTHER): Payer: 59 | Admitting: Rehabilitative and Restorative Service Providers"

## 2020-03-22 DIAGNOSIS — M25572 Pain in left ankle and joints of left foot: Secondary | ICD-10-CM

## 2020-03-22 DIAGNOSIS — R293 Abnormal posture: Secondary | ICD-10-CM | POA: Diagnosis not present

## 2020-03-22 DIAGNOSIS — R29898 Other symptoms and signs involving the musculoskeletal system: Secondary | ICD-10-CM

## 2020-03-22 DIAGNOSIS — M25561 Pain in right knee: Secondary | ICD-10-CM | POA: Diagnosis not present

## 2020-03-22 DIAGNOSIS — M94261 Chondromalacia, right knee: Secondary | ICD-10-CM | POA: Diagnosis not present

## 2020-03-22 NOTE — Therapy (Signed)
[place], [place], [address] Phone: [phone]   Fax:  [phone]  Physical Therapy Treatment  Patient Details  Name: Erin Byrd MRN: [id number] Date of Birth: 1991/04/30 Referring Provider (PT): Dr Georgina Snell    Encounter Date: 03/22/2020   PT End of Session - 03/22/20 1517    Visit Number 3    Number of Visits 12    Date for PT Re-Evaluation 04/26/20    PT Start Time 7342    PT Stop Time 1605    PT Time Calculation (min) 49 min    Activity Tolerance Patient tolerated treatment well           Past Medical History:  Diagnosis Date   Anxiety     Past Surgical History:  Procedure Laterality Date   APPENDECTOMY     ARM WOUND REPAIR / CLOSURE  2016   broken arm   LAPAROSCOPIC APPENDECTOMY N/A 11/15/2016   Procedure: APPENDECTOMY LAPAROSCOPIC;  Surgeon: Armandina Gemma, MD;  Location: [facility];  Service: General;  Laterality: N/A;   WISDOM TOOTH EXTRACTION      There were no vitals filed for this visit.   Subjective Assessment - 03/22/20 1518    Subjective Patitent reports that her ankle is doing well and is painfree. Knee is sore. Not sure if the tape helped. She took the tape off yesterday morning. noticed that the tape made her sore. She went for a walk yesterday and noticed soreness and discomfort in the knee yesterday and today.    Currently in Pain? Yes    Pain Score 1     Pain Location Knee    Pain Orientation Right    Pain Descriptors / Indicators Sore    Pain Score 0    Pain Location Ankle              [facility] PT Assessment - 03/22/20 0001      Assessment   Medical Diagnosis Rt knee pain; Lt ankle pain     Referring Provider (PT) Dr Georgina Snell     Onset Date/Surgical Date 01/04/20    Hand Dominance Left    Next MD Visit PRN     Prior Therapy none       Strength   Left Ankle Eversion --   5-/5 painful      Palpation   Palpation comment muscular tightness along distal  lateral quad; lateral ankle through the peroneal tendon with area of pain anterior and distal to lateral malleolus       Balance   Balance Assessed --   SLS bilat 10-15 sec                         [facility] Adult PT Treatment/Exercise - 03/22/20 0001      Knee/Hip Exercises: Stretches   Piriformis Stretch Right;Left;2 reps;30 seconds    Gastroc Stretch Right;Left;2 reps;30 seconds    Soleus Stretch Right;Left;2 reps;30 seconds      Knee/Hip Exercises: Aerobic   Recumbent Bike L2:  5 min       Knee/Hip Exercises: Standing   Terminal Knee Extension Strengthening;Right;10 reps   ball squeeze btn knees and behind Rt knee 5 sec hold    Lateral Step Up Right;Left;2 sets;10 reps;Step Height: 4"   heel tap without weight shift    Forward Step Up Right;Left;2 sets;10 reps;Step Height: 6";Hand Hold: 0    Wall Squat 10 reps;5 seconds   ball  squeeze between knees    SLS 10 sec x 3 reps each side     SLS with Vectors clock x 3 reps each side x 3 sets       Iontophoresis   Type of Iontophoresis Dexamethasone    Location Lt lateral ankle    Dose 1.0 cc; 120 m/Amp     Time 8 hr      Ankle Exercises: Seated   Other Seated Ankle Exercises ankle eversion green TB x 10 reps     Other Seated Ankle Exercises ankle dorsiflexion x 10 reps green TB                        PT Long Term Goals - 03/15/20 1714      PT LONG TERM GOAL #1   Title Improve posture and alignment with patient to demonstrate controlled LE alignment without standing with knees hyperextended    Time 6    Period Weeks    Status New    Target Date 04/26/20      PT LONG TERM GOAL #2   Title Decrease pain by 75-100% allowing patient to stand and walk with no pain and ascend/descend steps with 0-1/10 pain    Time 6    Period Weeks    Status New    Target Date 04/26/20      PT LONG TERM GOAL #3   Title Return to workouts with trainer with no increase in symptoms in the knees or ankles    Time 6     Period Weeks    Status New    Target Date 04/26/20      PT LONG TERM GOAL #4   Title Independent in HEP    Time 6    Period Weeks    Status New    Target Date 04/26/20      PT LONG TERM GOAL #5   Title Improve FOTO to </= 16% limitation    Time 6    Period Weeks    Status New    Target Date 04/26/20                 Plan - 03/22/20 1523    Clinical Impression Statement Excellent progress with rehab. Resolution of ankle pain. Soreness persists in the knee. Not sure tape is helpful. Working on strengthening and stabilization for knee and ankle, adding exercises without difficulty. Continued palpable tightness; weakness; pain through the Lt lateral ankle. Iontophoresis to address inflammation.    Rehab Potential Good    PT Frequency 2x / week    PT Duration 6 weeks    PT Treatment/Interventions Patient/family education;ADLs/Self Care Home Management;Aquatic Therapy;Cryotherapy;Electrical Stimulation;Iontophoresis 4mg /ml Dexamethasone;Moist Heat;Ultrasound;Gait training;Stair training;Functional mobility training;Therapeutic activities;Therapeutic exercise;Balance training;Neuromuscular re-education;Manual techniques;Dry needling;Taping    PT Next Visit Plan continue with stretching and strengthening bilat LE's; taping as needed for muscular alignment; progress higher level strengthening as tolerated; ionto Lt ankle as indicated    PT Home Exercise Plan [email or web address]    Consulted and Agree with Plan of Care Patient           Patient will benefit from skilled therapeutic intervention in order to improve the following deficits and impairments:     Visit Diagnosis: Acute pain of right knee  Chondromalacia of knee, right  Pain in left ankle and joints of left foot  Abnormal posture  Other symptoms and signs involving the musculoskeletal system     Problem List Patient Active  Problem List   Diagnosis Date Noted   Microscopic hematuria 11/02/2018   Generalized anxiety  disorder 11/24/2016   Acute appendicitis s/p lap appendectomy 11/15/2016 11/15/2016   Essential tremor 04/10/2014   External hemorrhoid 04/10/2014    Lester Platas Nilda Simmer PT, MPH  03/22/2020, 4:26 PM  [facility] [place], [place], [address] Phone: [phone]   Fax:  [phone]  Name: Robyn Galati MRN: [id number] Date of Birth: Jun 14, 1991

## 2020-03-25 ENCOUNTER — Ambulatory Visit (INDEPENDENT_AMBULATORY_CARE_PROVIDER_SITE_OTHER): Payer: 59 | Admitting: Physical Therapy

## 2020-03-25 ENCOUNTER — Other Ambulatory Visit: Payer: Self-pay

## 2020-03-25 DIAGNOSIS — R293 Abnormal posture: Secondary | ICD-10-CM

## 2020-03-25 DIAGNOSIS — M25572 Pain in left ankle and joints of left foot: Secondary | ICD-10-CM

## 2020-03-25 DIAGNOSIS — M94261 Chondromalacia, right knee: Secondary | ICD-10-CM | POA: Diagnosis not present

## 2020-03-25 DIAGNOSIS — M25561 Pain in right knee: Secondary | ICD-10-CM

## 2020-03-25 NOTE — Therapy (Signed)
[facility] [place], [place], [address] Phone: [phone]   Fax:  [phone]  Physical Therapy Treatment  Patient Details  Name: Erin Byrd MRN: [id number] Date of Birth: May 27, 1991 Referring Provider (PT): Dr Georgina Snell    Encounter Date: 03/25/2020   PT End of Session - 03/25/20 1659    Visit Number 4    Number of Visits 12    Date for PT Re-Evaluation 04/26/20    PT Start Time 2122    PT Stop Time 1655    PT Time Calculation (min) 39 min    Activity Tolerance Patient tolerated treatment well           Past Medical History:  Diagnosis Date  . Anxiety     Past Surgical History:  Procedure Laterality Date  . APPENDECTOMY    . ARM WOUND REPAIR / CLOSURE  2016   broken arm  . LAPAROSCOPIC APPENDECTOMY N/A 11/15/2016   Procedure: APPENDECTOMY LAPAROSCOPIC;  Surgeon: Armandina Gemma, MD;  Location: [facility];  Service: General;  Laterality: N/A;  . WISDOM TOOTH EXTRACTION      There were no vitals filed for this visit.   Subjective Assessment - 03/25/20 1625    Subjective Pt reports she worked out yesterday without any pain.  She feels the ionto patches have helped the ankle. It feels less sore with daily activities after application.   She will do a 5k (walk) this weekend.  She reports 85% improvement since starting therapy.   Currently in Pain? No/denies    Pain Score 0-No pain              [facility] PT Assessment - 03/25/20 0001      Assessment   Medical Diagnosis Rt knee pain; Lt ankle pain     Referring Provider (PT) Dr Georgina Snell     Onset Date/Surgical Date 01/04/20    Hand Dominance Left    Next MD Visit PRN     Prior Therapy none            [facility] Adult PT Treatment/Exercise - 03/25/20 0001      Knee/Hip Exercises: Stretches   Passive Hamstring Stretch Right;Left;2 reps;30 seconds    Gastroc Stretch Right;Left;2 reps;30 seconds      Knee/Hip Exercises: Aerobic   Elliptical L2: 5 min       Knee/Hip Exercises: Standing   SLS SLS mini squat with toe taps front, side, back x 8 reps each side; then single leg dead lifts x 8 reps each leg (to touch chair).     Other Standing Knee Exercises split squat with 1/2 depth x 10 each leg, light UE support on counter.   tandem stance on 1/2 foam roll x 30 sec, each leg forward. (flat side up)    Other Standing Knee Exercises warrior 1 with heel raises x 10 reps       Iontophoresis   Type of Iontophoresis Dexamethasone    Location Lt lateral ankle    Dose 1.0 cc; 120 m/Amp     Time 8 hr              PT Long Term Goals - 03/25/20 1626      PT LONG TERM GOAL #1   Title Improve posture and alignment with patient to demonstrate controlled LE alignment without standing with knees hyperextended    Time 6    Period Weeks    Status On-going      PT LONG  TERM GOAL #2   Title Decrease pain by 75-100% allowing patient to stand and walk with no pain and ascend/descend steps with 0-1/10 pain    Time 6    Period Weeks    Status Achieved      PT LONG TERM GOAL #3   Title Return to workouts with trainer with no increase in symptoms in the knees or ankles    Time 6    Period Weeks    Status Partially Met      PT LONG TERM GOAL #4   Title Independent in HEP    Time 6    Period Weeks    Status On-going      PT LONG TERM GOAL #5   Title Improve FOTO to </= 16% limitation    Time 6    Period Weeks    Status On-going                 Plan - 03/25/20 1629    Clinical Impression Statement Pt now reporting 85% reduction in pain and symptoms. Tolerated exercises well without increase in ankle/knee pain. Lt ankle fatigued with single leg squats. Encouraged self care of stretches and ice as she eases back into work outs with trainer.  Discussed pt adding limited depth squats and resisted side stepping with trainer.  Pt has partially met her goals and is progressing well toward remaining goal.    Rehab Potential Good    PT  Frequency 2x / week    PT Duration 6 weeks    PT Treatment/Interventions Patient/family education;ADLs/Self Care Home Management;Aquatic Therapy;Cryotherapy;Electrical Stimulation;Iontophoresis 21m/ml Dexamethasone;Moist Heat;Ultrasound;Gait training;Stair training;Functional mobility training;Therapeutic activities;Therapeutic exercise;Balance training;Neuromuscular re-education;Manual techniques;Dry needling;Taping    PT Next Visit Plan continue with stretching and strengthening bilat LE's; taping as needed for muscular alignment; progress higher level strengthening as tolerated; ionto Lt ankle as indicated    PT Home Exercise Plan [id number]    Consulted and Agree with Plan of Care Patient           Patient will benefit from skilled therapeutic intervention in order to improve the following deficits and impairments:     Visit Diagnosis: Acute pain of right knee  Chondromalacia of knee, right  Pain in left ankle and joints of left foot  Abnormal posture     Problem List Patient Active Problem List   Diagnosis Date Noted  . Microscopic hematuria 11/02/2018  . Generalized anxiety disorder 11/24/2016  . Acute appendicitis s/p lap appendectomy 11/15/2016 11/15/2016  . Essential tremor 04/10/2014  . External hemorrhoid 04/10/2014   JKerin Perna PTA 03/25/20 [facility] [place] [address]Phone: [phone]  Fax:  [phone] Name: Erin FlaniganMRN: [id number] of Birth: [birth year]

## 2020-04-01 ENCOUNTER — Other Ambulatory Visit: Payer: Self-pay

## 2020-04-01 ENCOUNTER — Ambulatory Visit (INDEPENDENT_AMBULATORY_CARE_PROVIDER_SITE_OTHER): Payer: 59 | Admitting: Physical Therapy

## 2020-04-01 ENCOUNTER — Encounter: Payer: Self-pay | Admitting: Physical Therapy

## 2020-04-01 DIAGNOSIS — R293 Abnormal posture: Secondary | ICD-10-CM

## 2020-04-01 DIAGNOSIS — M25561 Pain in right knee: Secondary | ICD-10-CM

## 2020-04-01 DIAGNOSIS — M25572 Pain in left ankle and joints of left foot: Secondary | ICD-10-CM | POA: Diagnosis not present

## 2020-04-01 DIAGNOSIS — M94261 Chondromalacia, right knee: Secondary | ICD-10-CM

## 2020-04-01 NOTE — Therapy (Addendum)
[place], [place], [address] Phone: [phone]   Fax:  [phone]  Physical Therapy Treatment  Patient Details  Name: Erin Byrd MRN: [id number] Date of Birth: Jun 10, 1991 Referring Provider (PT): Dr Georgina Snell    Encounter Date: 04/01/2020   PT End of Session - 04/01/20 0851    Visit Number 5    Number of Visits 12    Date for PT Re-Evaluation 04/26/20    PT Start Time 0849    PT Stop Time 0925    PT Time Calculation (min) 36 min    Activity Tolerance Patient tolerated treatment well;No increased pain    Behavior During Therapy WFL for tasks assessed/performed           Past Medical History:  Diagnosis Date  . Anxiety     Past Surgical History:  Procedure Laterality Date  . APPENDECTOMY    . ARM WOUND REPAIR / CLOSURE  2016   broken arm  . LAPAROSCOPIC APPENDECTOMY N/A 11/15/2016   Procedure: APPENDECTOMY LAPAROSCOPIC;  Surgeon: Armandina Gemma, MD;  Location: [facility];  Service: General;  Laterality: N/A;  . WISDOM TOOTH EXTRACTION      There were no vitals filed for this visit.   Subjective Assessment - 04/01/20 0851    Subjective Pt has eased into work outs with trainer; no pain during workouts.  5k went well.  Had some soreness in both knees the next day; resolved with single dose of Advil.  She is wearing knee brace on Rt knee during workouts.    Currently in Pain? No/denies    Pain Score 0-No pain              [facility] PT Assessment - 04/01/20 0001      Assessment   Medical Diagnosis Rt knee pain; Lt ankle pain     Referring Provider (PT) Dr Georgina Snell     Onset Date/Surgical Date 01/04/20    Hand Dominance Left    Next MD Visit PRN     Prior Therapy none       Observation/Other Assessments   Focus on Therapeutic Outcomes (FOTO)  15% limitation      Strength   Overall Strength Comments bilat ankle strength 5/5 without pain            [facility] Adult PT Treatment/Exercise -  04/01/20 0001      Knee/Hip Exercises: Stretches   Quad Stretch Right;Left;1 rep;30 seconds    Gastroc Stretch Both;1 rep;30 seconds      Knee/Hip Exercises: Aerobic   Recumbent Bike L3: 4.5 min for warm up.       Knee/Hip Exercises: Standing   Lateral Step Up Right;Left;1 set;10 reps;Hand Hold: 0   stepping up onto bosu   Forward Step Up Limitations Step ups on     SLS SLS mini squat with toe taps front, side, back x 5 reps each side.  SLS on blue pad with toe taps front, side, back x 5 rounds each leg.  SLS on upside down Bosu x 60 sec LLE, 30 sec RLE     Other Standing Knee Exercises heel/toe walking Rt/Lt x 16 ft     Other Standing Knee Exercises single leg dead lifts while standing on blue pad x 5 reps each leg, cues for form.               PT Long Term Goals - 04/01/20 0925      PT LONG TERM  GOAL #1   Title Improve posture and alignment with patient to demonstrate controlled LE alignment without standing with knees hyperextended    Time 6    Period Weeks    Status Achieved      PT LONG TERM GOAL #2   Title Decrease pain by 75-100% allowing patient to stand and walk with no pain and ascend/descend steps with 0-1/10 pain    Time 6    Period Weeks    Status Achieved      PT LONG TERM GOAL #3   Title Return to workouts with trainer with no increase in symptoms in the knees or ankles    Time 6    Period Weeks    Status Achieved      PT LONG TERM GOAL #4   Title Independent in HEP    Time 6    Period Weeks    Status Achieved      PT LONG TERM GOAL #5   Title Improve FOTO to </= 16% limitation    Time 6    Period Weeks    Status Achieved                 Plan - 04/01/20 0931    Clinical Impression Statement FOTO score improved to 15% limitation.  Pt has returned to work outs with trainer without any symptoms.  Pt tolerated all exercises well without pain; instructed to add to current trainer workout.  Pt has met all goals and is agreeable to d/c at this  time.    Rehab Potential Good    PT Frequency 2x / week    PT Duration 6 weeks    PT Treatment/Interventions Patient/family education;ADLs/Self Care Home Management;Aquatic Therapy;Cryotherapy;Electrical Stimulation;Iontophoresis 8m/ml Dexamethasone;Moist Heat;Ultrasound;Gait training;Stair training;Functional mobility training;Therapeutic activities;Therapeutic exercise;Balance training;Neuromuscular re-education;Manual techniques;Dry needling;Taping    PT Next Visit Plan will d/c to HEP.    PT Home Exercise Plan [id number]    Consulted and Agree with Plan of Care Patient           Patient will benefit from skilled therapeutic intervention in order to improve the following deficits and impairments:     Visit Diagnosis: Acute pain of right knee  Chondromalacia of knee, right  Pain in left ankle and joints of left foot  Abnormal posture     Problem List Patient Active Problem List   Diagnosis Date Noted  . Microscopic hematuria 11/02/2018  . Generalized anxiety disorder 11/24/2016  . Acute appendicitis s/p lap appendectomy 11/15/2016 11/15/2016  . Essential tremor 04/10/2014  . External hemorrhoid 04/10/2014   JKerin Perna PTA 04/01/20 9:34 AM  [place] [address]Phone: [phone]  Fax:  [phone] Name: Erin BodnerMRN: [id number] of Birth: [birth year] PHYSICAL THERAPY DISCHARGE SUMMARY  Visits from Start of Care: 5  Current functional level related to goals / functional outcomes: See progress note for discharge status    Remaining deficits: Doing well at last appointment with goals of PT accomplished   Education / Equipment: HEP   Plan: Patient agrees to discharge.  Patient goals were met. Patient is being discharged due to meeting the stated rehab goals.  ?????     Celyn P. HHelene KelpPT, MPH 04/09/20 9:50 AM

## 2020-04-12 MED FILL — TAYTULLA 1 MG-20 MCG CAP: 1-20 | 28 days supply | Qty: 28 | Fill #1

## 2020-04-20 ENCOUNTER — Ambulatory Visit: Payer: 59 | Admitting: Physician Assistant

## 2020-04-20 ENCOUNTER — Other Ambulatory Visit: Payer: Self-pay

## 2020-04-20 ENCOUNTER — Encounter: Payer: Self-pay | Admitting: Physician Assistant

## 2020-04-20 VITALS — BP 120/84 | HR 69 | Temp 98.2°F | Ht 67.0 in | Wt 168.0 lb

## 2020-04-20 DIAGNOSIS — H6122 Impacted cerumen, left ear: Secondary | ICD-10-CM

## 2020-04-20 DIAGNOSIS — H6983 Other specified disorders of Eustachian tube, bilateral: Secondary | ICD-10-CM

## 2020-04-20 NOTE — Patient Instructions (Addendum)
It was great to see you!  I think that you are dealing with Eustachian Tube Dysfunction with a little bit of wax build-up.  Start daily Flonase in AM and PM. This will help to open up the tube to reduce inflammation and improve your symptoms.  Also take daily allergy pill of your choice for your symptoms for about a week.  If no improvement, we will send to Ear Nose and Throat.   Eustachian Tube Dysfunction  Eustachian tube dysfunction refers to a condition in which a blockage develops in the narrow passage that connects the middle ear to the back of the nose (eustachian tube). The eustachian tube regulates air pressure in the middle ear by letting air move between the ear and nose. It also helps to drain fluid from the middle ear space. Eustachian tube dysfunction can affect one or both ears. When the eustachian tube does not function properly, air pressure, fluid, or both can build up in the middle ear. What are the causes? This condition occurs when the eustachian tube becomes blocked or cannot open normally. Common causes of this condition include:  Ear infections.  Colds and other infections that affect the nose, mouth, and throat (upper respiratory tract).  Allergies.  Irritation from cigarette smoke.  Irritation from stomach acid coming up into the esophagus (gastroesophageal reflux). The esophagus is the tube that carries food from the mouth to the stomach.  Sudden changes in air pressure, such as from descending in an airplane or scuba diving.  Abnormal growths in the nose or throat, such as: ? Growths that line the nose (nasal polyps). ? Abnormal growth of cells (tumors). ? Enlarged tissue at the back of the throat (adenoids). What increases the risk? You are more likely to develop this condition if:  You smoke.  You are overweight.  You are a child who has: ? Certain birth defects of the mouth, such as cleft palate. ? Large tonsils or adenoids. What are the  signs or symptoms? Common symptoms of this condition include:  A feeling of fullness in the ear.  Ear pain.  Clicking or popping noises in the ear.  Ringing in the ear.  Hearing loss.  Loss of balance.  Dizziness. Symptoms may get worse when the air pressure around you changes, such as when you travel to an area of high elevation, fly on an airplane, or go scuba diving. How is this diagnosed? This condition may be diagnosed based on:  Your symptoms.  A physical exam of your ears, nose, and throat.  Tests, such as those that measure: ? The movement of your eardrum (tympanogram). ? Your hearing (audiometry). How is this treated? Treatment depends on the cause and severity of your condition.  In mild cases, you may relieve your symptoms by moving air into your ears. This is called "popping the ears."  In more severe cases, or if you have symptoms of fluid in your ears, treatment may include: ? Medicines to relieve congestion (decongestants). ? Medicines that treat allergies (antihistamines). ? Nasal sprays or ear drops that contain medicines that reduce swelling (steroids). ? A procedure to drain the fluid in your eardrum (myringotomy). In this procedure, a small tube is placed in the eardrum to:  Drain the fluid.  Restore the air in the middle ear space. ? A procedure to insert a balloon device through the nose to inflate the opening of the eustachian tube (balloon dilation). Follow these instructions at home: Lifestyle  Do not do any  of the following until your health care provider approves: ? Travel to high altitudes. ? Fly in airplanes. ? Work in a Pension scheme manager or room. ? Scuba dive.  Do not use any products that contain nicotine or tobacco, such as cigarettes and e-cigarettes. If you need help quitting, ask your health care provider.  Keep your ears dry. Wear fitted earplugs during showering and bathing. Dry your ears completely after. General  instructions  Take over-the-counter and prescription medicines only as told by your health care provider.  Use techniques to help pop your ears as recommended by your health care provider. These may include: ? Chewing gum. ? Yawning. ? Frequent, forceful swallowing. ? Closing your mouth, holding your nose closed, and gently blowing as if you are trying to blow air out of your nose.  Keep all follow-up visits as told by your health care provider. This is important. Contact a health care provider if:  Your symptoms do not go away after treatment.  Your symptoms come back after treatment.  You are unable to pop your ears.  You have: ? A fever. ? Pain in your ear. ? Pain in your head or neck. ? Fluid draining from your ear.  Your hearing suddenly changes.  You become very dizzy.  You lose your balance. Summary  Eustachian tube dysfunction refers to a condition in which a blockage develops in the eustachian tube.  It can be caused by ear infections, allergies, inhaled irritants, or abnormal growths in the nose or throat.  Symptoms include ear pain, hearing loss, or ringing in the ears.  Mild cases are treated with maneuvers to unblock the ears, such as yawning or ear popping.  Severe cases are treated with medicines. Surgery may also be done (rare). This information is not intended to replace advice given to you by your health care provider. Make sure you discuss any questions you have with your health care provider. Document Revised: 10/09/2017 Document Reviewed: 10/09/2017 Elsevier Patient Education  [place].

## 2020-04-20 NOTE — Progress Notes (Signed)
Erin Byrd is a 29 y.o. female here for a new problem.  I acted as a Education administrator for [employer], PA-C Erin Pickler, LPN   History of Present Illness:   Chief Complaint  Patient presents with  . Otalgia    HPI   Otalgia Pt c/o bilateral ear fullness x 3 weeks, hears a high pitch whistle noise at times. Started in L ear and then went to the R ear. Most noticeable in the morning, symptoms improve as the day goes on. Did try OTC allergy med for a few days without relief. Pt did fly first week in Oct and has been having problem since returned.  Denies: fevers, chills, nausea, vomiting, recent URI, vertigo, vision changes   Past Medical History:  Diagnosis Date  . Anxiety      Social History   Tobacco Use  . Smoking status: Never Smoker  . Smokeless tobacco: Never Used  Vaping Use  . Vaping Use: Never used  Substance Use Topics  . Alcohol use: Yes    Alcohol/week: 2.0 - 3.0 standard drinks    Types: 2 - 3 Cans of beer per week  . Drug use: No    Past Surgical History:  Procedure Laterality Date  . APPENDECTOMY    . ARM WOUND REPAIR / CLOSURE  2016   broken arm  . LAPAROSCOPIC APPENDECTOMY N/A 11/15/2016   Procedure: APPENDECTOMY LAPAROSCOPIC;  Surgeon: Erin Gemma, MD;  Location: [facility];  Service: General;  Laterality: N/A;  . WISDOM TOOTH EXTRACTION      Family History  Problem Relation Age of Onset  . Hypertension Mother   . Hypertension Father   . Hypertension Paternal Grandmother   . Hypertension Paternal Grandfather   . Diabetes Paternal Grandfather   . Breast cancer Neg Hx   . Colon cancer Neg Hx     Allergies  Allergen Reactions  . Erythromycin Nausea And Vomiting and Other (See Comments)    Reaction:  Abdominal pain  . Penicillins Rash and Other (See Comments)    Has patient had a PCN reaction causing immediate rash, facial/tongue/throat swelling, SOB or lightheadedness with hypotension: No Has patient had a PCN reaction causing severe rash  involving mucus membranes or skin necrosis: No Has patient had a PCN reaction that required hospitalization No Has patient had a PCN reaction occurring within the last 10 years: No If all of the above answers are "NO", then may proceed with Cephalosporin use.    Current Medications:   Current Outpatient Medications:  .  acetaminophen (TYLENOL) 325 MG tablet, Take 2 tablets (650 mg total) by mouth every 6 (six) hours as needed for mild pain (or temp > 100)., Disp: , Rfl:  .  clonazePAM (KLONOPIN) 0.5 MG tablet, Take 1 tablet (0.5 mg total) by mouth at bedtime., Disp: 30 tablet, Rfl: 0 .  fluticasone (FLONASE) 50 MCG/ACT nasal spray, Place 2 sprays into both nostrils daily., Disp: 16 g, Rfl: 2 .  hydrocortisone (ANUSOL-HC) 2.5 % rectal cream, Place 1 application rectally 2 (two) times daily., Disp: 30 g, Rfl: 1 .  hydrocortisone (ANUSOL-HC) 25 MG suppository, Place 1 suppository (25 mg total) rectally 2 (two) times daily., Disp: 12 suppository, Rfl: 3 .  ibuprofen (ADVIL,MOTRIN) 200 MG tablet, You can take 2-3 tablets every 6 hours as needed.  You can alternate with Tylenol as needed or you can use prescribed pain medicine for an alternate pain medicine., Disp: , Rfl:  .  Norethin Ace-Eth Estrad-FE (TAYTULLA) 1-20 MG-MCG(24)  CAPS, Take 1 capsule by mouth daily., Disp: , Rfl:  .  propranolol (INDERAL) 20 MG tablet, Take 2 tablets in the morning, 1 in the afternoon, Disp: 270 tablet, Rfl: 1   Review of Systems:   ROS  Negative unless otherwise specified per HPI.  Vitals:   Vitals:   04/20/20 1530  BP: 120/84  Pulse: 69  Temp: 98.2 F (36.8 C)  TempSrc: Temporal  SpO2: 98%  Weight: 168 lb (76.2 kg)  Height: 5\' 7"  (1.702 m)     Body mass index is 26.31 kg/m.  Physical Exam:   Physical Exam Vitals and nursing note reviewed.  Constitutional:      General: She is not in acute distress.    Appearance: She is well-developed. She is not ill-appearing or toxic-appearing.  HENT:      Head: Normocephalic and atraumatic.     Right Ear: Tympanic membrane, ear canal and external ear normal. Tympanic membrane is not erythematous, retracted or bulging.     Left Ear: Tympanic membrane, ear canal and external ear normal. There is impacted cerumen. Tympanic membrane is not erythematous, retracted or bulging.     Nose: Nose normal.     Right Sinus: No maxillary sinus tenderness or frontal sinus tenderness.     Left Sinus: No maxillary sinus tenderness or frontal sinus tenderness.     Mouth/Throat:     Pharynx: Uvula midline. No posterior oropharyngeal erythema.  Eyes:     General: Lids are normal.     Conjunctiva/sclera: Conjunctivae normal.  Neck:     Trachea: Trachea normal.  Cardiovascular:     Rate and Rhythm: Normal rate and regular rhythm.     Heart sounds: Normal heart sounds, S1 normal and S2 normal.  Pulmonary:     Effort: Pulmonary effort is normal.     Breath sounds: Normal breath sounds. No decreased breath sounds, wheezing, rhonchi or rales.  Lymphadenopathy:     Cervical: No cervical adenopathy.  Skin:    General: Skin is warm and dry.  Neurological:     Mental Status: She is alert.  Psychiatric:        Speech: Speech normal.        Behavior: Behavior normal. Behavior is cooperative.    Ceruminosis is noted in L ear. Patient verbalized understanding to plan and in agreement to risks/benefits of procedures. Wax was removed by syringing.  She endorsed improvement of symptoms.  Assessment and Plan:   Erin Byrd was seen today for otalgia.  Diagnoses and all orders for this visit:  Impacted cerumen of left ear Tolerated procedure well and had resolution of symptoms. No evidence of infection on exam.  Dysfunction of both eustachian tubes Recommend twice daily flonase and allergy pill of choice x 1-2 weeks. If no improvement or any worsening symptoms, will refer to ENT.   CMA or LPN served as scribe during this visit. History, Physical, and Plan  performed by medical provider. The above documentation has been reviewed and is accurate and complete.   Erin Coke, PA-C

## 2020-04-28 DIAGNOSIS — N898 Other specified noninflammatory disorders of vagina: Secondary | ICD-10-CM | POA: Diagnosis not present

## 2020-04-28 DIAGNOSIS — Z113 Encounter for screening for infections with a predominantly sexual mode of transmission: Secondary | ICD-10-CM | POA: Diagnosis not present

## 2020-05-06 MED FILL — TAYTULLA 1 MG-20 MCG CAP: 1-20 | 28 days supply | Qty: 28 | Fill #2

## 2020-05-25 DIAGNOSIS — J029 Acute pharyngitis, unspecified: Secondary | ICD-10-CM | POA: Diagnosis not present

## 2020-05-25 DIAGNOSIS — Z20822 Contact with and (suspected) exposure to covid-19: Secondary | ICD-10-CM | POA: Diagnosis not present

## 2020-05-25 DIAGNOSIS — U071 COVID-19: Secondary | ICD-10-CM | POA: Diagnosis not present

## 2020-05-25 DIAGNOSIS — R059 Cough, unspecified: Secondary | ICD-10-CM | POA: Diagnosis not present

## 2020-05-31 MED FILL — TAYTULLA 1 MG-20 MCG CAP: 1-20 | 28 days supply | Qty: 28 | Fill #3

## 2020-06-08 ENCOUNTER — Other Ambulatory Visit (HOSPITAL_BASED_OUTPATIENT_CLINIC_OR_DEPARTMENT_OTHER): Payer: Self-pay | Admitting: Obstetrics and Gynecology

## 2020-06-08 MED FILL — METRONIDAZOLE 500 MG TABS: 500 | 7 days supply | Qty: 14 | Fill #0

## 2020-06-14 DIAGNOSIS — Z113 Encounter for screening for infections with a predominantly sexual mode of transmission: Secondary | ICD-10-CM | POA: Diagnosis not present

## 2020-06-17 ENCOUNTER — Other Ambulatory Visit: Payer: Self-pay

## 2020-06-17 ENCOUNTER — Encounter: Payer: Self-pay | Admitting: Physician Assistant

## 2020-06-17 ENCOUNTER — Other Ambulatory Visit: Payer: Self-pay | Admitting: Physician Assistant

## 2020-06-17 MED ORDER — PROPRANOLOL HCL 20 MG PO TABS: ORAL_TABLET | ORAL | 1 refills | Status: DC

## 2020-06-17 MED ORDER — ESCITALOPRAM OXALATE 10 MG PO TABS: 10.0000 mg | ORAL_TABLET | Freq: Every day | ORAL | 1 refills | Status: DC

## 2020-06-17 MED FILL — PROPRANOLOL 20 MG TABLET: 20 | 90 days supply | Qty: 270 | Fill #0

## 2020-06-17 MED FILL — ESCITALOPRAM 10 MG TABLET: 10 | 30 days supply | Qty: 30 | Fill #0

## 2020-06-24 MED FILL — TAYTULLA 1 MG-20 MCG CAP: 1-20 | 28 days supply | Qty: 28 | Fill #4

## 2020-07-16 ENCOUNTER — Other Ambulatory Visit (HOSPITAL_BASED_OUTPATIENT_CLINIC_OR_DEPARTMENT_OTHER): Payer: Self-pay | Admitting: Adult Health

## 2020-07-16 MED FILL — hydrOXYzine HCL 25 MG TABS: 25 | 30 days supply | Qty: 30 | Fill #0

## 2020-07-20 ENCOUNTER — Other Ambulatory Visit (HOSPITAL_BASED_OUTPATIENT_CLINIC_OR_DEPARTMENT_OTHER): Payer: Self-pay | Admitting: Obstetrics and Gynecology

## 2020-07-20 MED FILL — NORETHIND-ETH ESTRAD 1-0.02: 1-20 | 84 days supply | Qty: 63 | Fill #0

## 2020-07-27 ENCOUNTER — Other Ambulatory Visit (HOSPITAL_BASED_OUTPATIENT_CLINIC_OR_DEPARTMENT_OTHER): Payer: Self-pay | Admitting: Obstetrics and Gynecology

## 2020-07-27 DIAGNOSIS — B373 Candidiasis of vulva and vagina: Secondary | ICD-10-CM | POA: Diagnosis not present

## 2020-07-27 DIAGNOSIS — N898 Other specified noninflammatory disorders of vagina: Secondary | ICD-10-CM | POA: Diagnosis not present

## 2020-07-27 DIAGNOSIS — Z113 Encounter for screening for infections with a predominantly sexual mode of transmission: Secondary | ICD-10-CM | POA: Diagnosis not present

## 2020-07-27 MED FILL — FLUCONAZOLE 200 MG TAB: 200 | 6 days supply | Qty: 3 | Fill #0

## 2020-12-02 ENCOUNTER — Encounter: Payer: Self-pay | Admitting: Physician Assistant

## 2021-01-12 ENCOUNTER — Other Ambulatory Visit (HOSPITAL_BASED_OUTPATIENT_CLINIC_OR_DEPARTMENT_OTHER): Payer: Self-pay

## 2021-06-09 IMAGING — CT CT RENAL STONE PROTOCOL
2 of 4 series · 16 of 46 positions shown, 18 images · non-contrast
Comparison: 11/15/2016

CLINICAL DATA: Right-sided flank pain and recurrent UTIs

EXAM:
CT ABDOMEN AND PELVIS WITHOUT CONTRAST
TECHNIQUE: Multidetector CT imaging of the abdomen and pelvis was performed
following the standard protocol without IV contrast.

[Series 2: axial st · axial · 0.75mm/px · z∈[-483,-68]mm · 13 of 91 slices shown, 15 images]
[im 4/91  soft-tissue]
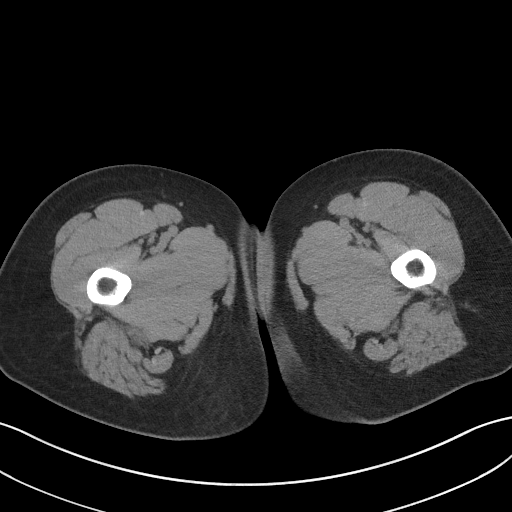
[im 4/91  bone]
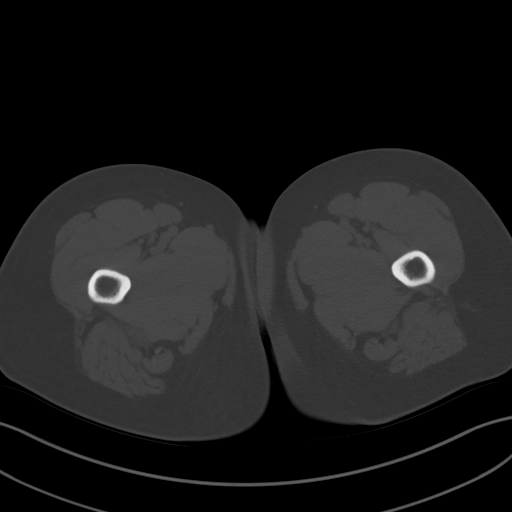
[im 12/91  soft-tissue]
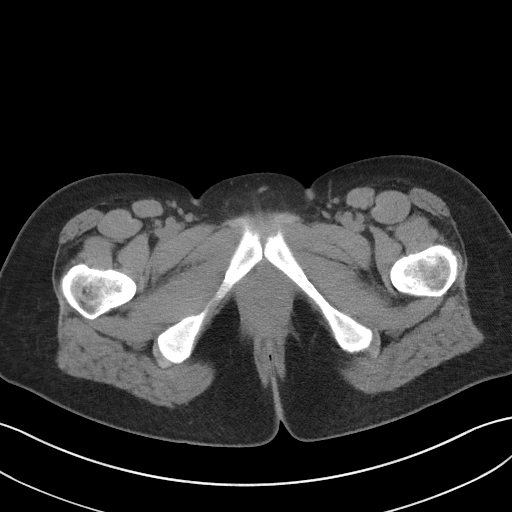
[im 19/91  soft-tissue]
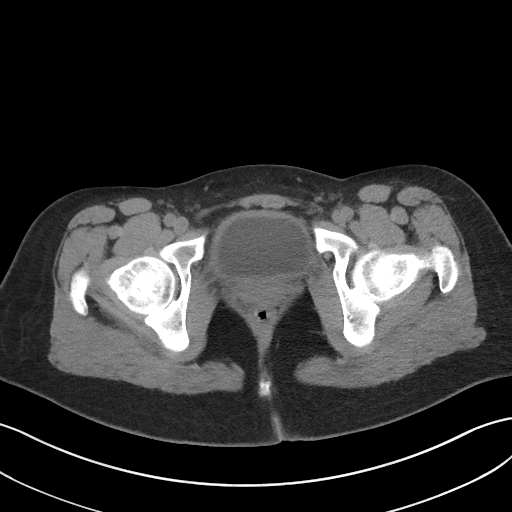
[im 27/91  soft-tissue]
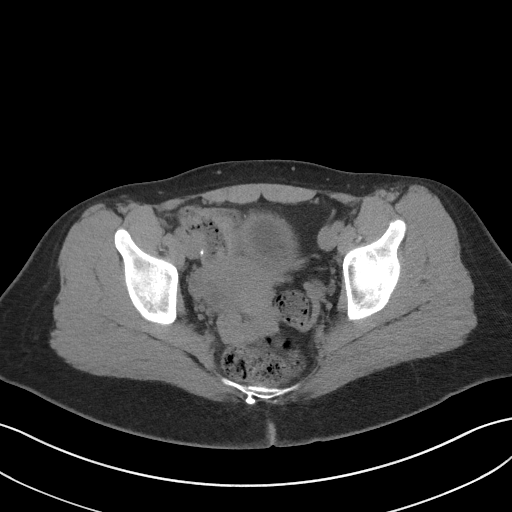
[im 31/91  soft-tissue]
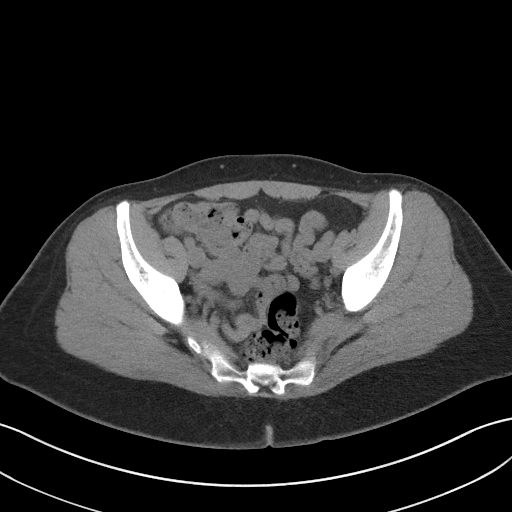
[im 38/91  soft-tissue]
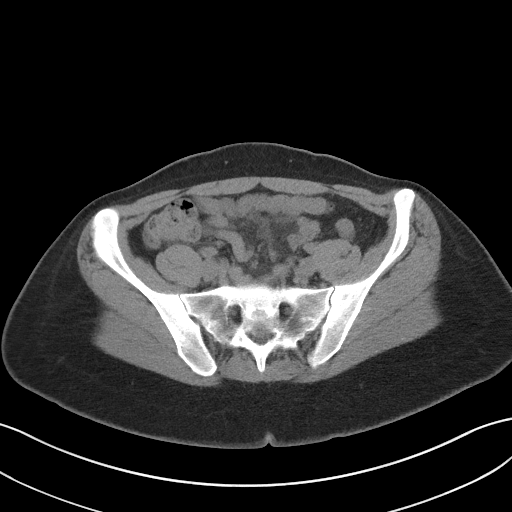
[im 46/91  soft-tissue]
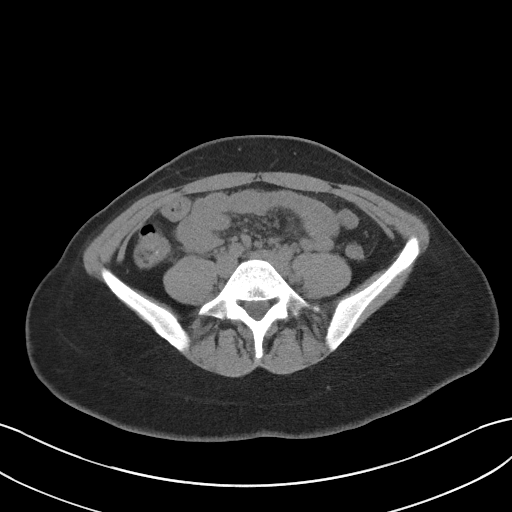
[im 53/91  soft-tissue]
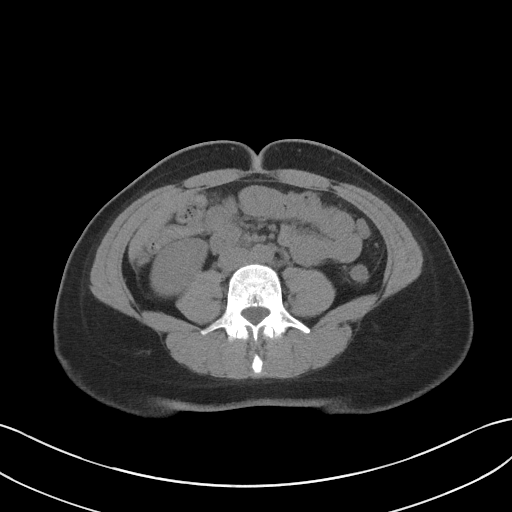
[im 61/91  soft-tissue]
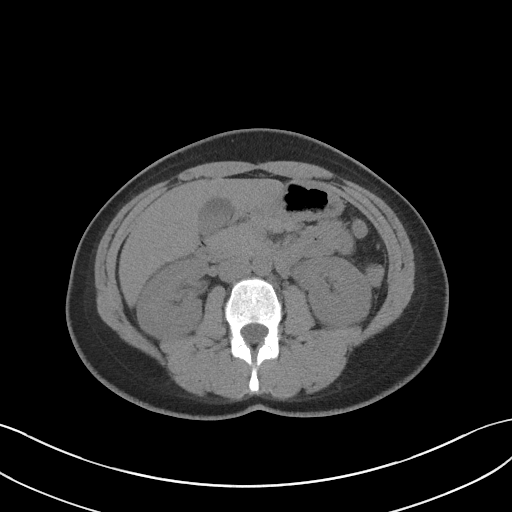
[im 61/91  bone]
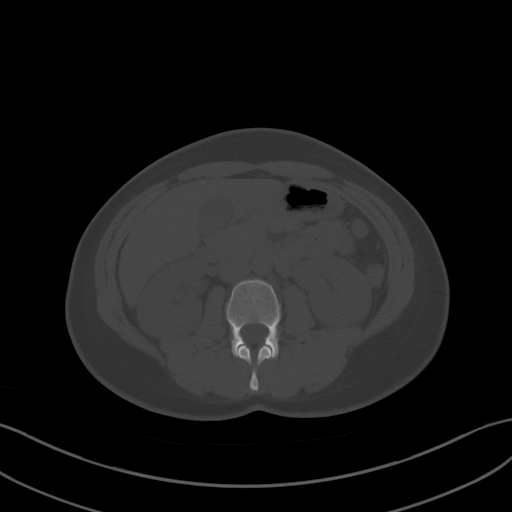
[im 64/91  soft-tissue]
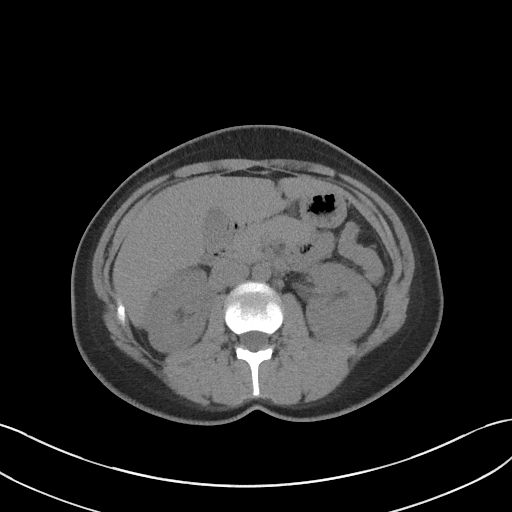
[im 72/91  soft-tissue]
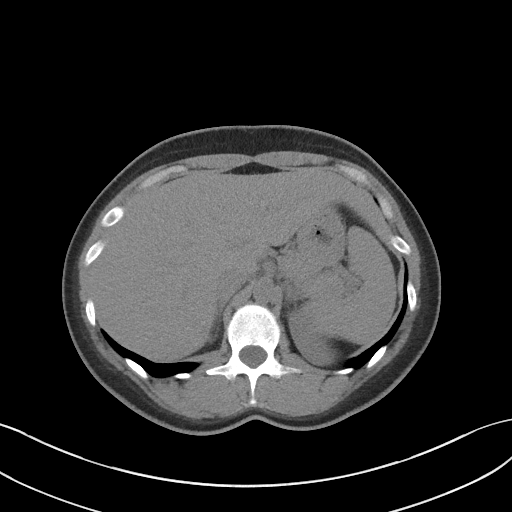
[im 79/91  soft-tissue]
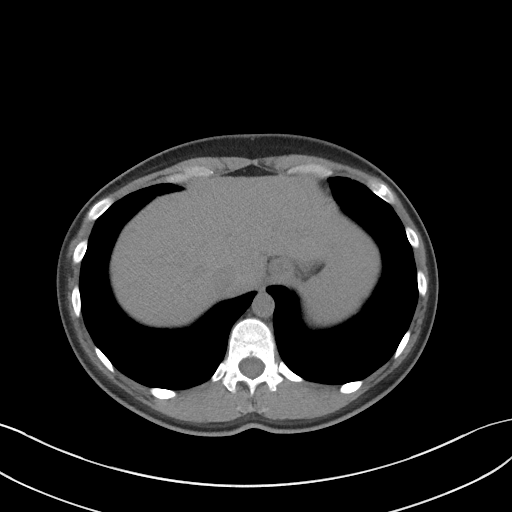
[im 87/91  soft-tissue]
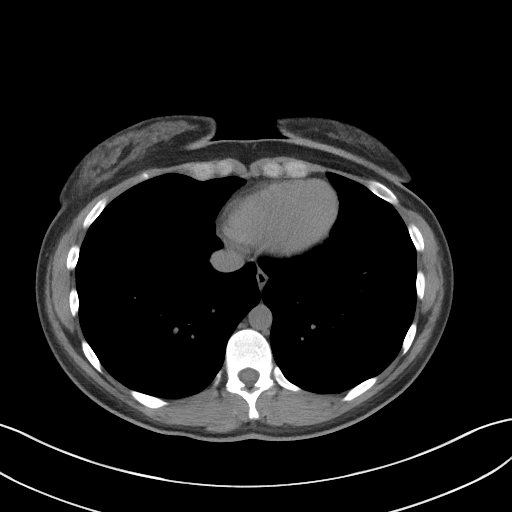

[Series 4: coronal st · coronal · 0.78mm/px · 3 of 82 slices shown]
[im 28/82  soft-tissue]
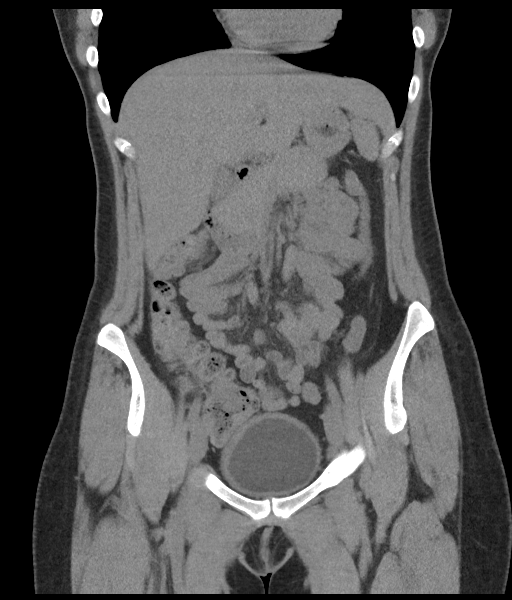
[im 37/82  soft-tissue]
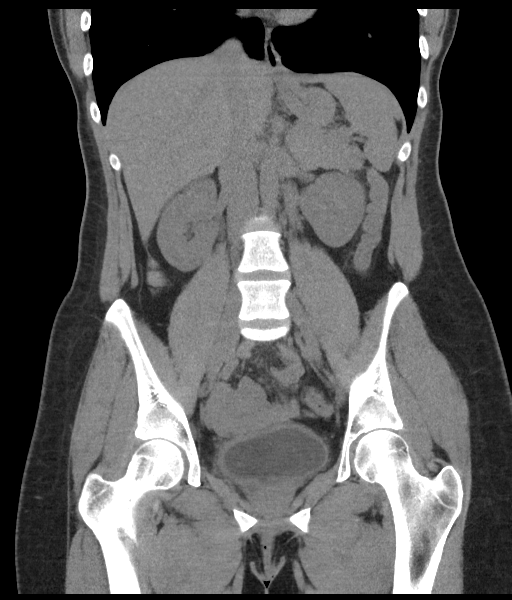
[im 46/82  soft-tissue]
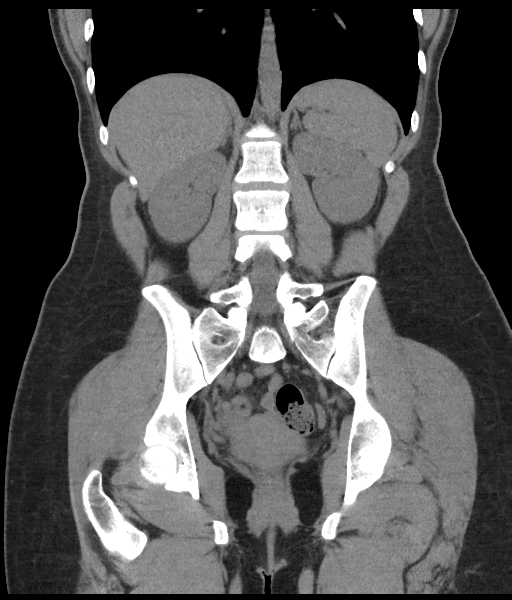

[16 of 46 positions shown; findings below may reference images not displayed]

FINDINGS: Lower chest: No acute abnormality.

Hepatobiliary: No focal liver abnormality is seen. No gallstones,
gallbladder wall thickening, or biliary dilatation.

Pancreas: Unremarkable. No pancreatic ductal dilatation or
surrounding inflammatory changes.

Spleen: Normal in size without focal abnormality.

Adrenals/Urinary Tract: Adrenal glands are within normal limits.
Kidneys are well visualized bilaterally. Stable cortical
calcification is noted within the right kidney. No definitive renal
calculi are seen. Bladder is decompressed. Mild bladder wall
thickening is noted likely related to the decompressed state
although may be related to underlying inflammatory change given the
patient's clinical history.

Stomach/Bowel: The appendix has been surgically removed. No
obstructive or inflammatory changes of the colon are seen. The small
bowel and stomach are within normal limits.

Vascular/Lymphatic: No significant vascular findings are present. No
enlarged abdominal or pelvic lymph nodes.

Reproductive: Uterus and bilateral adnexa are unremarkable.

Other: No abdominal wall hernia or abnormality. No abdominopelvic
ascites.

Musculoskeletal: No acute or significant osseous findings.
IMPRESSION: Mild bladder wall thickening which may be related to the partially
decompressed state or postinflammatory in nature given the patient's
history of chronic UTIs.

No renal calculi or obstructive changes are noted.

No other focal abnormality is seen.

## 2021-07-11 ENCOUNTER — Other Ambulatory Visit: Payer: Self-pay | Admitting: Physician Assistant

## 2022-03-27 ENCOUNTER — Encounter: Payer: Self-pay | Admitting: *Deleted

## 2022-06-13 DIAGNOSIS — Z0000 Encounter for general adult medical examination without abnormal findings: Secondary | ICD-10-CM | POA: Diagnosis not present

## 2022-06-15 ENCOUNTER — Encounter: Payer: Self-pay | Admitting: *Deleted

## 2022-08-04 ENCOUNTER — Other Ambulatory Visit (HOSPITAL_BASED_OUTPATIENT_CLINIC_OR_DEPARTMENT_OTHER): Payer: Self-pay
